# Patient Record
Sex: Female | Born: 1985 | Race: White | Hispanic: No | Marital: Single | Smoking: Former smoker
Health system: Southern US, Community
[De-identification: ages and names within clinical notes are randomized; demographics above are authoritative.]

## PROBLEM LIST (undated history)

## (undated) DIAGNOSIS — G43909 Migraine, unspecified, not intractable, without status migrainosus: Secondary | ICD-10-CM

## (undated) HISTORY — PX: HERNIA REPAIR: SHX51

## (undated) HISTORY — DX: Migraine, unspecified, not intractable, without status migrainosus: G43.909

---
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
## 2007-04-12 ENCOUNTER — Ambulatory Visit: Payer: Self-pay | Admitting: Internal Medicine

## 2008-01-06 DIAGNOSIS — O1493 Unspecified pre-eclampsia, third trimester: Secondary | ICD-10-CM

## 2009-03-07 ENCOUNTER — Ambulatory Visit: Payer: Self-pay | Admitting: Internal Medicine

## 2009-05-29 ENCOUNTER — Ambulatory Visit: Payer: Self-pay | Admitting: Family Medicine

## 2018-05-23 ENCOUNTER — Encounter: Payer: Self-pay | Admitting: Emergency Medicine

## 2018-05-23 ENCOUNTER — Emergency Department
Admission: EM | Admit: 2018-05-23 | Discharge: 2018-05-23 | Disposition: A | Discharge status: Home / Self Care | Payer: Self-pay | Attending: Emergency Medicine | Admitting: Emergency Medicine

## 2018-05-23 ENCOUNTER — Other Ambulatory Visit: Payer: Self-pay

## 2018-05-23 ENCOUNTER — Emergency Department: Payer: Self-pay

## 2018-05-23 DIAGNOSIS — I82811 Embolism and thrombosis of superficial veins of right lower extremities: Secondary | ICD-10-CM | POA: Insufficient documentation

## 2018-05-23 DIAGNOSIS — Z87891 Personal history of nicotine dependence: Secondary | ICD-10-CM | POA: Insufficient documentation

## 2018-05-23 LAB — CBC WITH DIFFERENTIAL/PLATELET
Abs Immature Granulocytes: 0.01 10*3/uL (ref 0.00–0.07)
Basophils Absolute: 0.1 10*3/uL (ref 0.0–0.1)
Basophils Relative: 1 %
Eosinophils Absolute: 0.2 10*3/uL (ref 0.0–0.5)
Eosinophils Relative: 3 %
HCT: 36.1 % (ref 36.0–46.0)
Hemoglobin: 12 g/dL (ref 12.0–15.0)
Immature Granulocytes: 0 %
Lymphocytes Relative: 24 %
Lymphs Abs: 1.4 10*3/uL (ref 0.7–4.0)
MCH: 27.6 pg (ref 26.0–34.0)
MCHC: 33.2 g/dL (ref 30.0–36.0)
MCV: 83.2 fL (ref 80.0–100.0)
Monocytes Absolute: 0.5 10*3/uL (ref 0.1–1.0)
Monocytes Relative: 8 %
Neutro Abs: 3.7 10*3/uL (ref 1.7–7.7)
Neutrophils Relative %: 64 %
Platelets: 238 10*3/uL (ref 150–400)
RBC: 4.34 MIL/uL (ref 3.87–5.11)
RDW: 13 % (ref 11.5–15.5)
WBC: 5.8 10*3/uL (ref 4.0–10.5)
nRBC: 0 % (ref 0.0–0.2)

## 2018-05-23 LAB — COMPREHENSIVE METABOLIC PANEL
ALT: 16 U/L (ref 0–44)
AST: 22 U/L (ref 15–41)
Albumin: 4.3 g/dL (ref 3.5–5.0)
Alkaline Phosphatase: 40 U/L (ref 38–126)
Anion gap: 9 (ref 5–15)
BUN: 7 mg/dL (ref 6–20)
CO2: 25 mmol/L (ref 22–32)
Calcium: 8.9 mg/dL (ref 8.9–10.3)
Chloride: 103 mmol/L (ref 98–111)
Creatinine, Ser: 0.74 mg/dL (ref 0.44–1.00)
GFR calc Af Amer: >60 mL/min (ref >60)
GFR calc non Af Amer: >60 mL/min (ref >60)
Glucose, Bld: 78 mg/dL (ref 70–99)
Potassium: 3.4 mmol/L — ABNORMAL LOW (ref 3.5–5.1)
Sodium: 137 mmol/L (ref 135–145)
Total Bilirubin: 0.5 mg/dL (ref 0.3–1.2)
Total Protein: 7.8 g/dL (ref 6.5–8.1)

## 2018-05-23 LAB — URIC ACID: Uric Acid, Serum: 4.2 mg/dL (ref 2.5–7.1)

## 2018-05-23 NOTE — Discharge Instructions (Addendum)
You have a blood clot in your superficial vein near your ankle.  Take a regular aspirin daily.  Follow-up with your regular doctor in 2 weeks for recheck.  Return to the emergency department if the swelling or pain is increasing.  Wear compression socks.  Stop taking your birth control pills

## 2018-05-23 NOTE — ED Provider Notes (Signed)
[facility] Emergency Department Provider Note  ____________________________________________   First MD Initiated Contact with Patient 05/23/18 1908     (approximate)  I have reviewed the triage vital signs and the nursing notes.   HISTORY  Chief Complaint Poss DVT    HPI Nicole Berg is a 33 y.o. female presents emergency department with right ankle pain and redness with some swelling to the right calf.  She discussed this with her PCP explained to her that she needs to come to the emergency department to rule out DVT.  She denies any chest pain or shortness of breath.  She denies fever or chills.  No history of other joint pain.  She does take birth control pills.  Non-smoker.  She states she does eat hamburger and hot dog tight meat on a regular basis.  No recent increase in EtOH.    History reviewed. No pertinent past medical history.  There are no active problems to display for this patient.   Past Surgical History:  Procedure Laterality Date  . HERNIA REPAIR      Prior to Admission medications   Not on File    Allergies Amoxicillin and Penicillins  History reviewed. No pertinent family history.  Social History Social History   Tobacco Use  . Smoking status: Former Research scientist (life sciences)  . Smokeless tobacco: Never Used  Substance Use Topics  . Alcohol use: Yes    Comment: Rare  . Drug use: Never    Review of Systems  Constitutional: No fever/chills Eyes: No visual changes. ENT: No sore throat. Respiratory: Denies cough Genitourinary: Negative for dysuria. Musculoskeletal: Negative for back pain.  Positive for right leg pain Skin: Negative for rash.    ____________________________________________   PHYSICAL EXAM:  VITAL SIGNS: ED Triage Vitals  Enc Vitals Group     BP 05/23/18 1847 105/67     Pulse Rate 05/23/18 1847 78     Resp --      Temp 05/23/18 1847 98 F (36.7 C)     Temp Source 05/23/18 1847 Oral     SpO2 05/23/18  1847 99 %     Weight 05/23/18 1848 169 lb (76.7 kg)     Height 05/23/18 1848 '5\' 6"'  (1.676 m)     Head Circumference --      Peak Flow --      Pain Score 05/23/18 1849 4     Pain Loc --      Pain Edu? --      Excl. in [place]? --     Constitutional: Alert and oriented. Well appearing and in no acute distress. Eyes: Conjunctivae are normal.  Head: Atraumatic. Nose: No congestion/rhinnorhea. Mouth/Throat: Mucous membranes are moist.   Neck:  supple no lymphadenopathy noted Cardiovascular: Normal rate, regular rhythm. Heart sounds are normal Respiratory: Normal respiratory effort.  No retractions, lungs c t a  GU: deferred Musculoskeletal: FROM all extremities, warm and well perfused, redness and swelling are noted at the right ankle in the medial aspect spreading up into the lower part of the calf.  Negative Homans sign.  Neurovascular appears to be intact. Neurologic:  Normal speech and language.  Skin:  Skin is warm, dry and intact.  Positive redness and swelling to the right ankle Psychiatric: Mood and affect are normal. Speech and behavior are normal.  ____________________________________________   LABS (all labs ordered are listed, but only abnormal results are displayed)  Labs Reviewed  COMPREHENSIVE METABOLIC PANEL - Abnormal; Notable for the following  components:      Result Value   Potassium 3.4 (*)    All other components within normal limits  CBC WITH DIFFERENTIAL/PLATELET  URIC ACID   ____________________________________________   ____________________________________________  RADIOLOGY  Ultrasound right lower leg shows a superficial great saphenous vein thrombosis near the ankle  ____________________________________________   PROCEDURES  Procedure(s) performed: No  Procedures    ____________________________________________   INITIAL IMPRESSION / ASSESSMENT AND PLAN / ED COURSE  Pertinent labs & imaging results that were available during my care of  the patient were reviewed by me and considered in my medical decision making (see chart for details).   Patient is a 33 year old female presents emergency department with concerns of a DVT of the right lower leg due to increased redness and swelling of the right ankle.  Physical exam shows the right ankle to be swollen with a reddened area and streak going to the calf.  Ordered ultrasound of the right lower leg to rule out DVT  Labs ordered    ----------------------------------------- 8:29 PM on 05/23/2018 -----------------------------------------  Ultrasound showed a superficial great saphenous vein thrombosis near the ankle. Labs for CBC, met c, and uric acid are all normal  Explained the findings to the patient.  Due to this being a superficial thrombosis she was instructed to take a full aspirin daily.  Follow-up with her regular doctor in 2 weeks for recheck.  Explained to her she may need an additional ultrasound.  If she is worsening she is to return to the emergency department.  She is to stop taking her birth control pills at this time.  Wear compression socks.  Elevate the leg if possible.  She states she understands and will comply.  She was discharged in stable condition.  As part of my medical decision making, I reviewed the following data within the [place] notes reviewed and incorporated, Labs reviewed CBC, met C, uric acid are normal, Old chart reviewed, Radiograph reviewed ultrasound of the right lower leg shows a superficial thrombosis, Notes from prior ED visits and [place] Controlled Substance Database  ____________________________________________   FINAL CLINICAL IMPRESSION(S) / ED DIAGNOSES  Final diagnoses:  Thrombosis of saphenous vein, right      NEW MEDICATIONS STARTED DURING THIS VISIT:  New Prescriptions   No medications on file     Note:  This document was prepared using Dragon voice recognition software and may include  unintentional dictation errors.    Versie Starks, PA-C 05/23/18 2030    Carrie Mew, MD 05/26/18 [phone]

## 2018-05-23 NOTE — ED Triage Notes (Signed)
Pt presents to ED via POV with c/o R ankle pain and redness and swelling to R calf, pt states sent by PCP to R/o DVT.

## 2019-11-19 ENCOUNTER — Emergency Department
Admission: EM | Admit: 2019-11-19 | Discharge: 2019-11-19 | Disposition: A | Discharge status: Home / Self Care | Payer: BC Managed Care – PPO | Attending: Emergency Medicine | Admitting: Emergency Medicine

## 2019-11-19 ENCOUNTER — Emergency Department: Payer: BC Managed Care – PPO

## 2019-11-19 ENCOUNTER — Other Ambulatory Visit: Payer: Self-pay

## 2019-11-19 DIAGNOSIS — S161XXA Strain of muscle, fascia and tendon at neck level, initial encounter: Secondary | ICD-10-CM | POA: Diagnosis not present

## 2019-11-19 DIAGNOSIS — Y92410 Unspecified street and highway as the place of occurrence of the external cause: Secondary | ICD-10-CM | POA: Diagnosis not present

## 2019-11-19 DIAGNOSIS — M791 Myalgia, unspecified site: Secondary | ICD-10-CM | POA: Diagnosis not present

## 2019-11-19 DIAGNOSIS — S199XXA Unspecified injury of neck, initial encounter: Secondary | ICD-10-CM | POA: Diagnosis present

## 2019-11-19 DIAGNOSIS — Y9389 Activity, other specified: Secondary | ICD-10-CM | POA: Diagnosis not present

## 2019-11-19 DIAGNOSIS — M7918 Myalgia, other site: Secondary | ICD-10-CM

## 2019-11-19 DIAGNOSIS — Z87891 Personal history of nicotine dependence: Secondary | ICD-10-CM | POA: Insufficient documentation

## 2019-11-19 MED ORDER — ORPHENADRINE CITRATE ER 100 MG PO TB12: 100.0000 mg | ORAL_TABLET | Freq: Two times a day (BID) | ORAL | 0 refills | Status: DC

## 2019-11-19 MED ORDER — LIDOCAINE 5 % EX PTCH: 1.0000 | MEDICATED_PATCH | Freq: Two times a day (BID) | CUTANEOUS | 0 refills | Status: DC

## 2019-11-19 MED ORDER — LIDOCAINE 5 % EX PTCH: 1.0000 | MEDICATED_PATCH | Freq: Two times a day (BID) | CUTANEOUS | 0 refills | Status: AC

## 2019-11-19 MED ORDER — NAPROXEN 500 MG PO TABS: 500.0000 mg | ORAL_TABLET | Freq: Two times a day (BID) | ORAL | Status: DC
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
## 2019-11-19 NOTE — ED Notes (Signed)
Patient verbalizes understanding of discharge instructions. Opportunity for questioning and answers were provided. Armband removed by staff, pt discharged from ED. Pt ambulated out to lobby  
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
## 2019-11-19 NOTE — Discharge Instructions (Signed)
No acute findings on x-ray of the cervical/lumbar spine.  Follow discharge care instructions using heat instead of cold as directed.  Take medication as directed.

## 2020-01-01 ENCOUNTER — Emergency Department
Admission: EM | Admit: 2020-01-01 | Discharge: 2020-01-01 | Disposition: A | Discharge status: Home / Self Care | Payer: BC Managed Care – PPO | Attending: Emergency Medicine | Admitting: Emergency Medicine

## 2020-01-01 ENCOUNTER — Encounter: Payer: Self-pay | Admitting: Emergency Medicine

## 2020-01-01 DIAGNOSIS — R0981 Nasal congestion: Secondary | ICD-10-CM | POA: Diagnosis present

## 2020-01-01 DIAGNOSIS — J069 Acute upper respiratory infection, unspecified: Secondary | ICD-10-CM | POA: Diagnosis not present

## 2020-01-01 DIAGNOSIS — Z20822 Contact with and (suspected) exposure to covid-19: Secondary | ICD-10-CM | POA: Diagnosis not present

## 2020-01-01 DIAGNOSIS — Z87891 Personal history of nicotine dependence: Secondary | ICD-10-CM | POA: Insufficient documentation

## 2020-01-01 DIAGNOSIS — B349 Viral infection, unspecified: Secondary | ICD-10-CM | POA: Diagnosis not present

## 2020-01-01 LAB — RESP PANEL BY RT-PCR (FLU A&B, COVID) ARPGX2
Influenza A by PCR: NEGATIVE
Influenza B by PCR: NEGATIVE
SARS Coronavirus 2 by RT PCR: POSITIVE — AB

## 2020-01-01 MED ORDER — FLUTICASONE PROPIONATE 50 MCG/ACT NA SUSP: 1.0000 | Freq: Two times a day (BID) | NASAL | 0 refills | Status: DC

## 2020-01-01 MED ORDER — PSEUDOEPH-BROMPHEN-DM 30-2-10 MG/5ML PO SYRP: 10.0000 mL | ORAL_SOLUTION | Freq: Four times a day (QID) | ORAL | 0 refills | Status: DC | PRN

## 2020-01-01 NOTE — ED Triage Notes (Signed)
C/O runny nose, fever today.  States was around a coworker who has been exposed to COVID.  AAOx3.  Skin warm and dry. NAD

## 2020-01-01 NOTE — ED Provider Notes (Signed)
[facility] Emergency Department Provider Note  ____________________________________________  Time seen: Approximately 7:01 PM  I have reviewed the triage vital signs and the nursing notes.   HISTORY  Chief Complaint URI    HPI Nicole Berg is a 34 y.o. female who presents the emergency department complaining of nasal congestion, sore throat, cough.  Patient states that she was exposed to a coworker who has tested positive for Covid.  Patient with no reported fevers or chills, difficulty breathing, GI complaints.  Patient is here primarily for testing for Covid.  No significant past medical history.  No chronic medications.  No other complaints at this time.         History reviewed. No pertinent past medical history.  There are no problems to display for this patient.   Past Surgical History:  Procedure Laterality Date  . HERNIA REPAIR      Prior to Admission medications   Medication Sig Start Date End Date Taking? Authorizing Provider  brompheniramine-pseudoephedrine-DM 30-2-10 MG/5ML syrup Take 10 mLs by mouth 4 (four) times daily as needed. 01/01/20   Topaz Raglin, Delorise Royals, PA-C  fluticasone (FLONASE) 50 MCG/ACT nasal spray Place 1 spray into both nostrils 2 (two) times daily. 01/01/20   Gwynn Chalker, Delorise Royals, PA-C  lidocaine (LIDODERM) 5 % Place 1 patch onto the skin every 12 (twelve) hours. Remove & Discard patch within 12 hours or as directed by MD 11/19/19 11/18/20  Joni Reining, PA-C  naproxen (NAPROSYN) 500 MG tablet Take 1 tablet (500 mg total) by mouth 2 (two) times daily with a meal. 11/19/19   Joni Reining, PA-C  orphenadrine (NORFLEX) 100 MG tablet Take 1 tablet (100 mg total) by mouth 2 (two) times daily. 11/19/19   Joni Reining, PA-C    Allergies Amoxicillin and Penicillins  No family history on file.  Social History Social History   Tobacco Use  . Smoking status: Former Games developer  . Smokeless tobacco: Never Used   Substance Use Topics  . Alcohol use: Yes    Comment: Rare  . Drug use: Never     Review of Systems  Constitutional: No fever/chills Eyes: No visual changes. No discharge ENT: Nasal congestion and sore throat Cardiovascular: no chest pain. Respiratory: Positive cough. No SOB. Gastrointestinal: No abdominal pain.  No nausea, no vomiting.  No diarrhea.  No constipation. Musculoskeletal: Negative for musculoskeletal pain. Skin: Negative for rash, abrasions, lacerations, ecchymosis. Neurological: Negative for headaches, focal weakness or numbness.  10 System ROS otherwise negative.  ____________________________________________   PHYSICAL EXAM:  VITAL SIGNS: ED Triage Vitals  Enc Vitals Group     BP 01/01/20 1755 97/64     Pulse Rate 01/01/20 1755 68     Resp 01/01/20 1755 18     Temp 01/01/20 1755 98 F (36.7 C)     Temp Source 01/01/20 1755 Oral     SpO2 01/01/20 1755 100 %     Weight 01/01/20 1720 167 lb 1.7 oz (75.8 kg)     Height 01/01/20 1720 5\' 6"  (1.676 m)     Head Circumference --      Peak Flow --      Pain Score 01/01/20 1720 0     Pain Loc --      Pain Edu? --      Excl. in [place]? --      Constitutional: Alert and oriented. Well appearing and in no acute distress. Eyes: Conjunctivae are normal. PERRL. EOMI. Head: Atraumatic. ENT:  Ears:       Nose: Significant clear congestion/rhinnorhea.      Mouth/Throat: Mucous membranes are moist.  No oropharyngeal erythema or edema.  Uvula is midline. Neck: No stridor.    Lymph: No appreciable cervical lymphadenopathy Cardiovascular: Normal rate, regular rhythm. Normal S1 and S2.  Good peripheral circulation. Respiratory: Normal respiratory effort without tachypnea or retractions. Lungs CTAB. Good air entry to the bases with no decreased or absent breath sounds. Gastrointestinal: Bowel sounds 4 quadrants. Soft and nontender to palpation. No guarding or rigidity. No palpable masses. No distention.   Musculoskeletal: Full range of motion to all extremities. No gross deformities appreciated. Neurologic:  Normal speech and language. No gross focal neurologic deficits are appreciated.  Skin:  Skin is warm, dry and intact. No rash noted. Psychiatric: Mood and affect are normal. Speech and behavior are normal. Patient exhibits appropriate insight and judgement.   ____________________________________________   LABS (all labs ordered are listed, but only abnormal results are displayed)  Labs Reviewed  RESP PANEL BY RT-PCR (FLU A&B, COVID) [id number]   ____________________________________________  EKG   ____________________________________________  RADIOLOGY   No results found.  ____________________________________________    PROCEDURES  Procedure(s) performed:    Procedures    Medications - No data to display   ____________________________________________   INITIAL IMPRESSION / ASSESSMENT AND PLAN / ED COURSE  Pertinent labs & imaging results that were available during my care of the patient were reviewed by me and considered in my medical decision making (see chart for details).  Review of the [place] CSRS was performed in accordance of the [facility] prior to dispensing any controlled drugs.           Patient's diagnosis is consistent with viral illness, suspected COVID-19.  Patient presented to the emergency department with URI symptoms after having close contact with a coworker who tested positive for COVID-19.  Given her symptoms she was required to be tested by her work.  Exam is reassuring.  No indication for labs or imaging other than Covid swab.  Patient will be tested at this time.  I will place the patient on Flonase and Bromfed cough syrup for symptom control.  Follow-up primary care as needed.  Return precautions discussed with the patient..  Patient is given ED precautions to return to the ED for any worsening or new  symptoms.     ____________________________________________  FINAL CLINICAL IMPRESSION(S) / ED DIAGNOSES  Final diagnoses:  Viral illness      NEW MEDICATIONS STARTED DURING THIS VISIT:  ED Discharge Orders         Ordered    fluticasone (FLONASE) 50 MCG/ACT nasal spray  2 times daily        01/01/20 1909    brompheniramine-pseudoephedrine-DM 30-2-10 MG/5ML syrup  4 times daily PRN        01/01/20 1909              This chart was dictated using voice recognition software/Dragon. Despite best efforts to proofread, errors can occur which can change the meaning. Any change was purely unintentional.    Racheal Patches, PA-C 01/01/20 Brent Bulla, MD 01/01/20 2242

## 2020-01-01 NOTE — ED Notes (Addendum)
Pt was exposed to a coworker on Friday who tested positive for covid. Pt reports fever and congestion beginning last night.   Pt reports headache, congested cough, chills, mild nausea, and mild SOB with activity. Pt denies cp, abd pain, vomiting, diarrhea  Reports some intermittent dizziness.   No covid vaccines

## 2020-01-02 ENCOUNTER — Telehealth: Payer: Self-pay | Admitting: Nurse Practitioner

## 2020-01-02 ENCOUNTER — Telehealth (HOSPITAL_COMMUNITY): Payer: Self-pay

## 2020-01-02 NOTE — Telephone Encounter (Signed)
Called to Discuss with patient about Covid symptoms and the use of the monoclonal antibody infusion for those with mild to moderate Covid symptoms and at a high risk of hospitalization.     Pt appears to qualify for this infusion due to co-morbid conditions and/or a member of an at-risk group in accordance with the FDA Emergency Use Authorization. (asthma/bmi >25).  Patient pre-screened by RN and verbalized request for infusion.    Unable to reach pt. Voicemail full. My Chart message sent.   Willette Alma, NP [facility]  [phone]

## 2020-01-02 NOTE — Telephone Encounter (Signed)
Called patient to pre-screen for monoclonal antibody infusion after receiving recent positive test. Patient qualifies based off off co-morbid condition and/or member of an at risk group. CPT and REV codes provided for insurance purposes.   Hx of asthma and BMI >25  Patient is interested in learning more about the infusion. RN forwarded information to APP's for additional screening/scheduling.   Nicole Berg Loyola Mast, RN

## 2020-05-16 IMAGING — US RIGHT LOWER EXTREMITY VENOUS ULTRASOUND
1 series · 13 of 24 positions shown · non-contrast
Comparison: None

CLINICAL DATA: Right lower extremity pain and swelling



[Series 1: right lower extremity venous ultrasound · 13 of 57 slices shown]
[im 1/57]
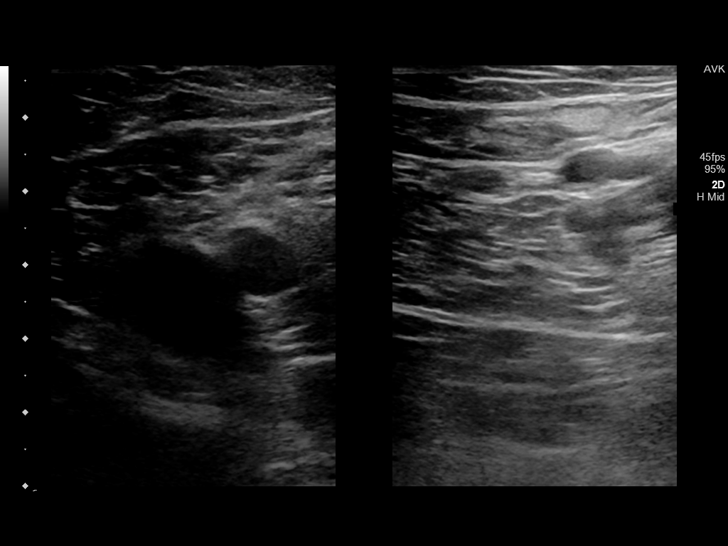
[im 5/57]
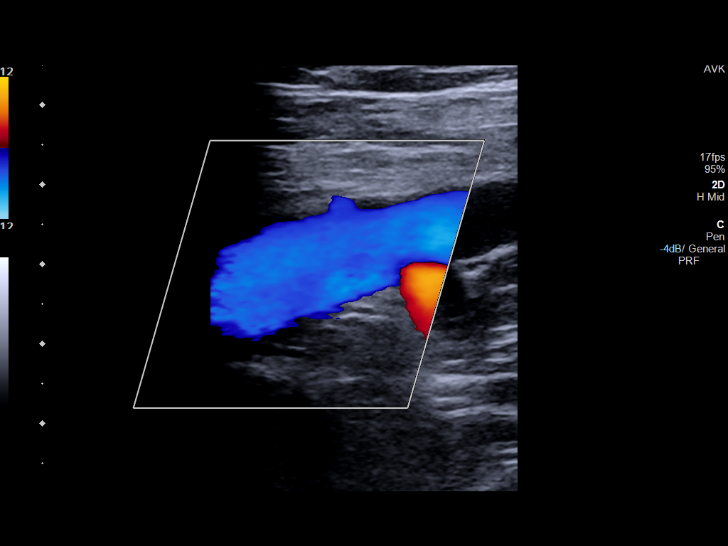
[im 10/57]
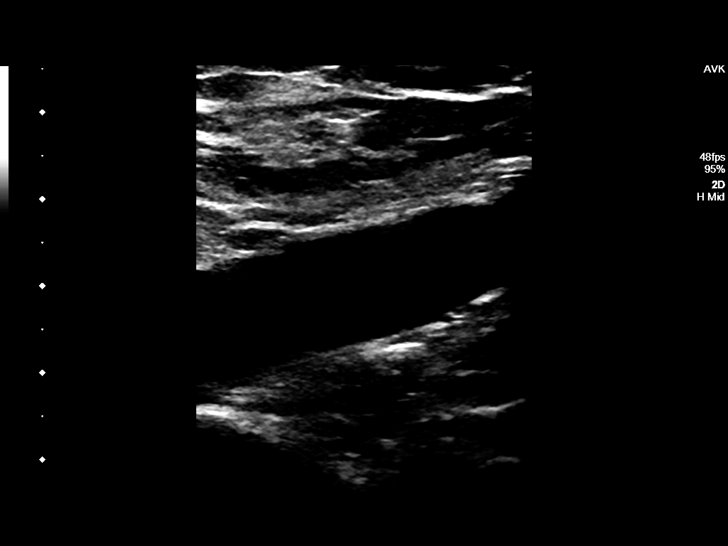
[im 15/57]
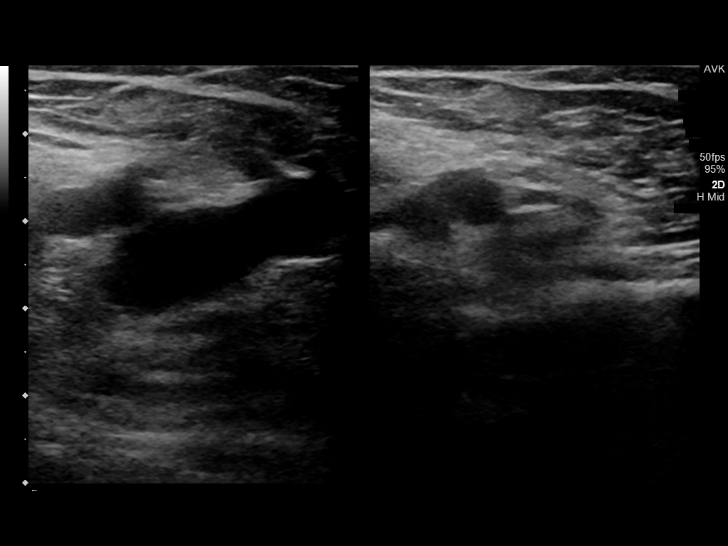
[im 20/57]
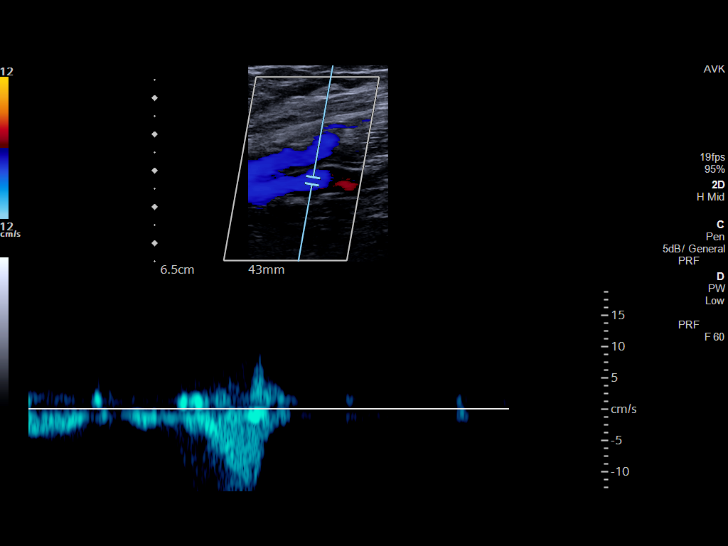
[im 25/57]
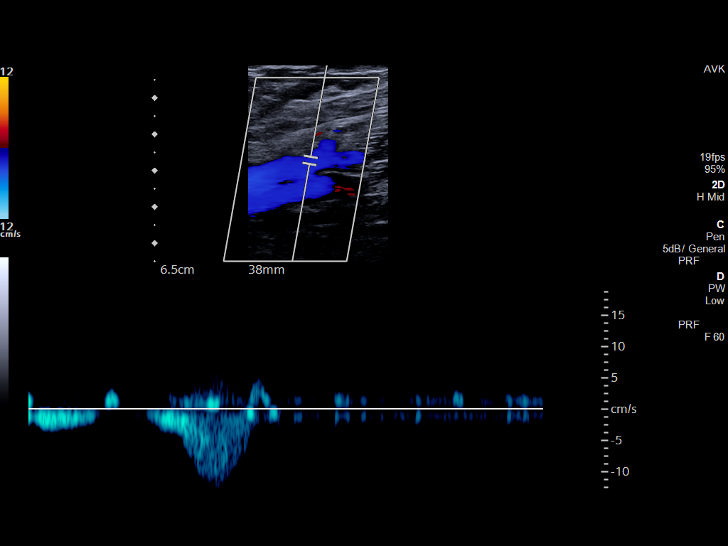
[im 30/57]
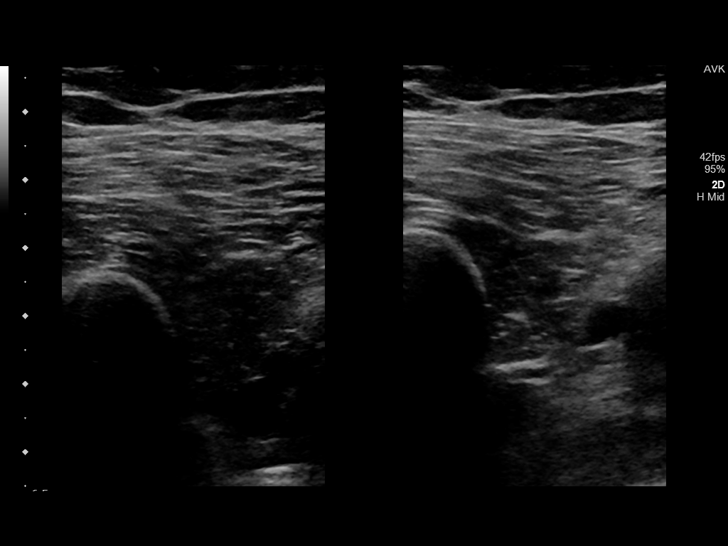
[im 32/57]
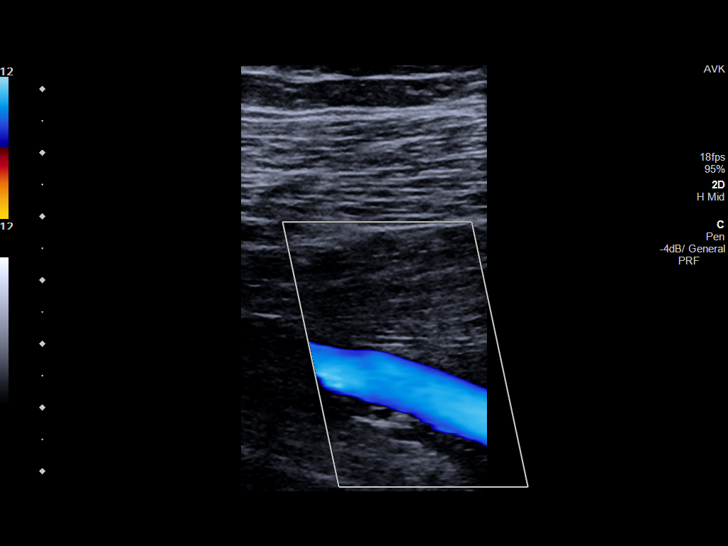
[im 37/57]
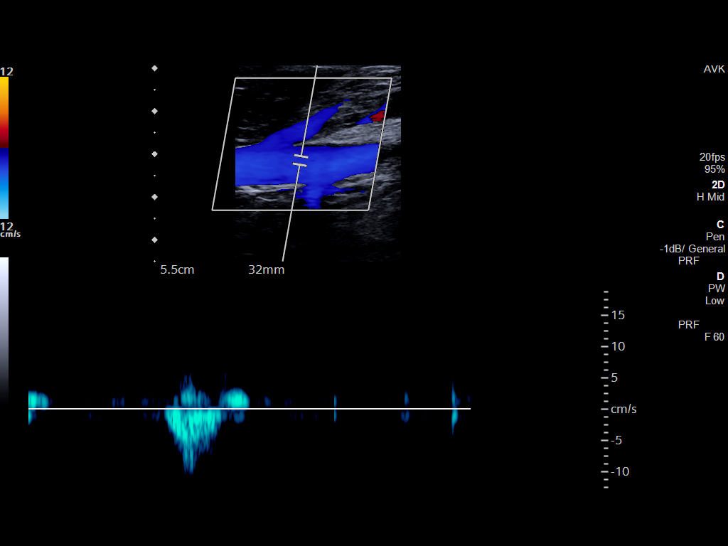
[im 42/57]
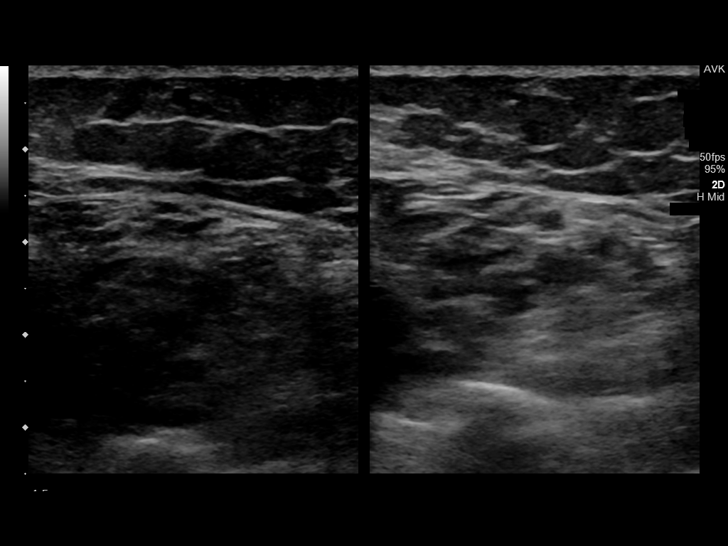
[im 47/57]
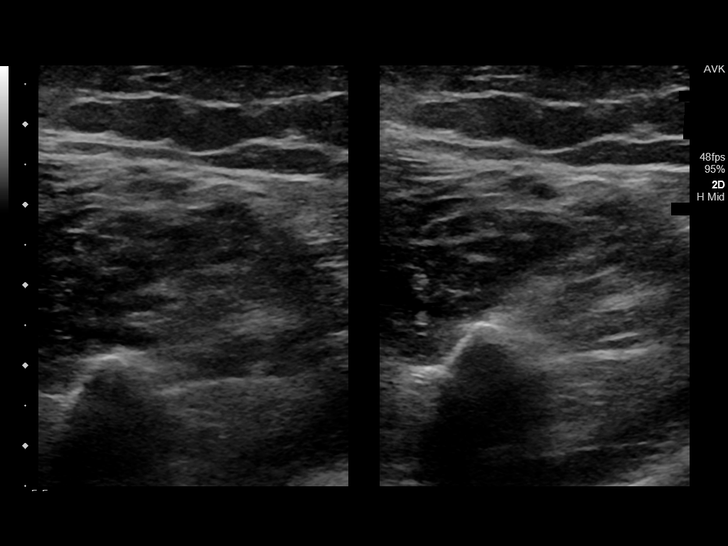
[im 52/57]
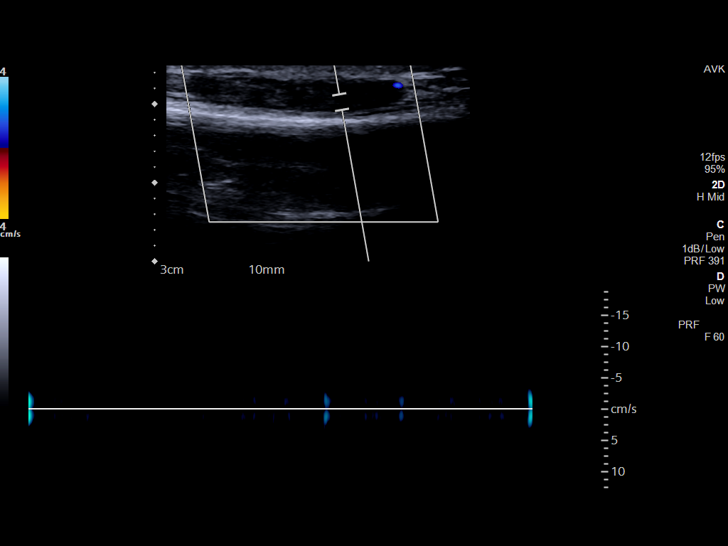
[im 57/57]
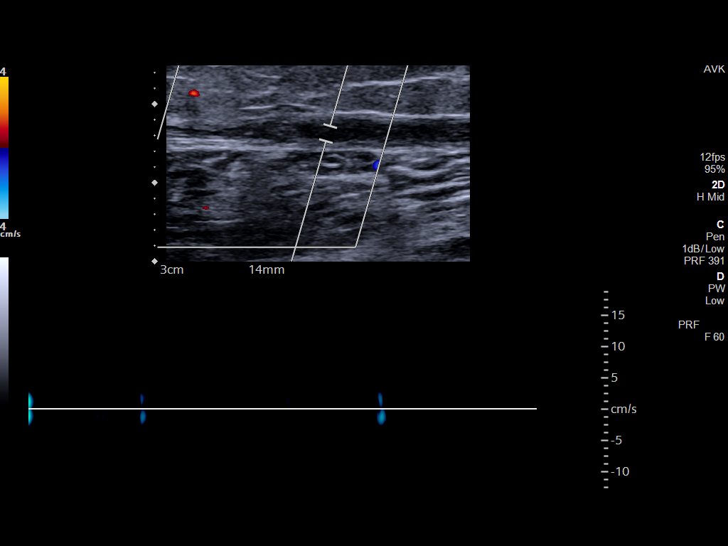

[13 of 24 positions shown; findings below may reference images not displayed]

FINDINGS: Contralateral Common Femoral Vein: Respiratory phasicity is normal
and symmetric with the symptomatic side. No evidence of thrombus.
Normal compressibility.

Common Femoral Vein: No evidence of thrombus. Normal
compressibility, respiratory phasicity and response to augmentation.

Saphenofemoral Junction: No evidence of thrombus. Normal
compressibility and flow on color Doppler imaging.

Profunda Femoral Vein: No evidence of thrombus. Normal
compressibility and flow on color Doppler imaging.

Femoral Vein: No evidence of thrombus. Normal compressibility,
respiratory phasicity and response to augmentation.

Popliteal Vein: No evidence of thrombus. Normal compressibility,
respiratory phasicity and response to augmentation.

Calf Veins: No evidence of thrombus. Normal compressibility and flow
on color Doppler imaging.

Superficial Great Saphenous Vein: There is noncompressible,
occlusive thrombus within the superficial great saphenous vein at
the calf/ankle.

Venous Reflux:  None.

Other Findings:  None.
IMPRESSION: Occlusive thrombus within the right superficial great saphenous vein
at the calf/ankle.

## 2020-07-29 ENCOUNTER — Emergency Department: Payer: BC Managed Care – PPO

## 2020-07-29 ENCOUNTER — Emergency Department
Admission: EM | Admit: 2020-07-29 | Discharge: 2020-07-29 | Disposition: A | Discharge status: Home / Self Care | Payer: BC Managed Care – PPO | Attending: Emergency Medicine | Admitting: Emergency Medicine

## 2020-07-29 ENCOUNTER — Other Ambulatory Visit: Payer: Self-pay

## 2020-07-29 ENCOUNTER — Encounter: Payer: Self-pay | Admitting: Emergency Medicine

## 2020-07-29 DIAGNOSIS — S93401A Sprain of unspecified ligament of right ankle, initial encounter: Secondary | ICD-10-CM | POA: Diagnosis not present

## 2020-07-29 DIAGNOSIS — S99911A Unspecified injury of right ankle, initial encounter: Secondary | ICD-10-CM | POA: Diagnosis present

## 2020-07-29 DIAGNOSIS — Z87891 Personal history of nicotine dependence: Secondary | ICD-10-CM | POA: Diagnosis not present

## 2020-07-29 DIAGNOSIS — X501XXA Overexertion from prolonged static or awkward postures, initial encounter: Secondary | ICD-10-CM | POA: Diagnosis not present

## 2020-07-29 DIAGNOSIS — Y990 Civilian activity done for income or pay: Secondary | ICD-10-CM | POA: Diagnosis not present

## 2020-07-29 DIAGNOSIS — M25579 Pain in unspecified ankle and joints of unspecified foot: Secondary | ICD-10-CM

## 2020-07-29 NOTE — Discharge Instructions (Addendum)
Follow-up with orthopedics. Elevate and ice the ankle Return emergency department worsening Follow-up Worker's Comp. instructions for the job for you were injured No work at the second job until Sunday

## 2020-07-29 NOTE — ED Triage Notes (Signed)
Pt comes into the ED via POV c/o right ankle pain after twisting it at work,.  Pt has no deformity and has normal gait upon triage.  Pt in NAD.

## 2020-07-29 NOTE — ED Provider Notes (Signed)
[facility] Emergency Department Provider Note  ____________________________________________   Event Date/Time   First MD Initiated Contact with Patient 07/29/20 1124     (approximate)  I have reviewed the triage vital signs and the nursing notes.   HISTORY  Chief Complaint Ankle Pain    HPI Nicole Berg is a 35 y.o. female presents emergency department after twisting her ankle at work yesterday.  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  Patient states been swollen and tender since last night.  No numbness or tingling  History reviewed. No pertinent past medical history.  There are no problems to display for this patient.   Past Surgical History:  Procedure Laterality Date   HERNIA REPAIR      Prior to Admission medications   Medication Sig Start Date End Date Taking? Authorizing Provider  brompheniramine-pseudoephedrine-DM 30-2-10 MG/5ML syrup Take 10 mLs by mouth 4 (four) times daily as needed. 01/01/20   Cuthriell, Delorise Royals, PA-C  fluticasone (FLONASE) 50 MCG/ACT nasal spray Place 1 spray into both nostrils 2 (two) times daily. 01/01/20   Cuthriell, Delorise Royals, PA-C  lidocaine (LIDODERM) 5 % Place 1 patch onto the skin every 12 (twelve) hours. Remove & Discard patch within 12 hours or as directed by MD 11/19/19 11/18/20  Joni Reining, PA-C  naproxen (NAPROSYN) 500 MG tablet Take 1 tablet (500 mg total) by mouth 2 (two) times daily with a meal. 11/19/19   Joni Reining, PA-C  orphenadrine (NORFLEX) 100 MG tablet Take 1 tablet (100 mg total) by mouth 2 (two) times daily. 11/19/19   Joni Reining, PA-C    Allergies Amoxicillin and Penicillins  History reviewed. No pertinent family history.  Social History Social History   Tobacco Use   Smoking status: Former    Pack years: 0.00   Smokeless tobacco: Never   Substance Use Topics   Alcohol use: Yes    Comment: Rare   Drug use: Never    Review of Systems  Constitutional: No fever/chills Eyes: No visual changes. ENT: No sore throat. Respiratory: Denies cough Cardiovascular: Denies chest pain Gastrointestinal: Denies abdominal pain Genitourinary: Negative for dysuria. Musculoskeletal: Negative for back pain.  Positive for right ankle pain Skin: Negative for rash. Psychiatric: no mood changes,     ____________________________________________   PHYSICAL EXAM:  VITAL SIGNS: ED Triage Vitals  Enc Vitals Group     BP 07/29/20 1021 110/70     Pulse Rate 07/29/20 1021 68     Resp 07/29/20 1021 16     Temp 07/29/20 1021 98 F (36.7 C)     Temp Source 07/29/20 1021 Oral     SpO2 07/29/20 1021 100 %     Weight 07/29/20 1019 170 lb (77.1 kg)     Height 07/29/20 1019 5\' 6"  (1.676 m)     Head Circumference --      Peak Flow --      Pain Score 07/29/20 1014 5     Pain Loc --      Pain Edu? --      Excl. in GC? --     Constitutional: Alert and oriented. Well appearing and in no acute distress. Eyes: Conjunctivae are normal.  Head: Atraumatic. Nose: No congestion/rhinnorhea. Mouth/Throat: Mucous membranes are moist.   Neck:  supple no lymphadenopathy noted Cardiovascular: Normal rate, regular rhythm.  Respiratory: Normal respiratory effort.  No retractions GU: deferred Musculoskeletal: FROM all extremities, warm and well perfused, right ankle is tender and swollen laterally, full range of motion noted, neurovascular is intact Neurologic:  Normal speech and language.  Skin:  Skin is warm, dry and intact. No rash noted. Psychiatric: Mood and affect are normal. Speech and behavior are normal.  ____________________________________________   LABS (all labs ordered are listed, but only abnormal results are displayed)  Labs Reviewed - No data to  display ____________________________________________   ____________________________________________  RADIOLOGY  X-ray of the right ankle  ____________________________________________   PROCEDURES  Procedure(s) performed: Stirrup Aircast applied by nursing staff   Procedures    ____________________________________________   INITIAL IMPRESSION / ASSESSMENT AND PLAN / ED COURSE  Pertinent labs & imaging results that were available during my care of the patient were reviewed by me and considered in my medical decision making (see chart for details).   Patient is 35 year old female presents with right ankle injury.  See HPI.  Physical exam shows patient per stable  X-ray of the right ankle reviewed by me confirmed by radiology to be negative for fracture, soft tissue swelling noted.  Explained the findings to the patient.  She was placed in a stirrup splint.  Given work restrictions.  Work note for her second job.  She is to elevate and ice.  Take over-the-counter Tylenol or ibuprofen as needed for pain.  Follow-up with orthopedics.  She is discharged stable condition.     Nicole Berg was evaluated in Emergency Department on 07/29/2020 for the symptoms described in the history of present illness. She was evaluated in the context of the global COVID-19 pandemic, which necessitated consideration that the patient might be at risk for infection with the SARS-CoV-2 virus that causes COVID-19. Institutional protocols and algorithms that pertain to the evaluation of patients at risk for COVID-19 are in a state of rapid change based on information released by regulatory bodies including the CDC and federal and state organizations. These policies and algorithms were followed during the patient's care in the ED.    As part of my medical decision making, I reviewed the following data within the electronic MEDICAL RECORD NUMBER Nursing notes reviewed and incorporated, Old chart reviewed, Radiograph  reviewed , Notes from prior ED visits, and [place] Controlled Substance Database  ____________________________________________   FINAL CLINICAL IMPRESSION(S) / ED DIAGNOSES  Final diagnoses:  Ankle pain  Sprain of right ankle, unspecified ligament, initial encounter      NEW MEDICATIONS STARTED DURING THIS VISIT:  Discharge Medication List as of 07/29/2020 12:01 PM       Note:  This document was prepared using Dragon voice recognition software and may include unintentional dictation errors.    Faythe Ghee, PA-C 07/29/20 1313    Shaune Pollack, MD 08/02/20 [phone]

## 2021-04-21 ENCOUNTER — Other Ambulatory Visit: Payer: BC Managed Care – PPO

## 2021-05-11 ENCOUNTER — Ambulatory Visit (INDEPENDENT_AMBULATORY_CARE_PROVIDER_SITE_OTHER): Payer: Self-pay | Admitting: Obstetrics & Gynecology

## 2021-05-11 ENCOUNTER — Ambulatory Visit: Payer: Self-pay

## 2021-05-11 ENCOUNTER — Other Ambulatory Visit (HOSPITAL_COMMUNITY)
Admission: RE | Admit: 2021-05-11 | Discharge: 2021-05-11 | Disposition: A | Discharge status: Home / Self Care | Payer: Medicaid Other | Source: Ambulatory Visit | Attending: Obstetrics & Gynecology | Admitting: Obstetrics & Gynecology

## 2021-05-11 ENCOUNTER — Encounter: Payer: Self-pay | Admitting: Obstetrics & Gynecology

## 2021-05-11 VITALS — BP 113/74 | HR 82 | Wt 205.0 lb

## 2021-05-11 DIAGNOSIS — Z8759 Personal history of other complications of pregnancy, childbirth and the puerperium: Secondary | ICD-10-CM

## 2021-05-11 DIAGNOSIS — Z3A13 13 weeks gestation of pregnancy: Secondary | ICD-10-CM

## 2021-05-11 DIAGNOSIS — O09521 Supervision of elderly multigravida, first trimester: Secondary | ICD-10-CM | POA: Diagnosis not present

## 2021-05-11 DIAGNOSIS — Z3490 Encounter for supervision of normal pregnancy, unspecified, unspecified trimester: Secondary | ICD-10-CM | POA: Insufficient documentation

## 2021-05-11 DIAGNOSIS — Z86718 Personal history of other venous thrombosis and embolism: Secondary | ICD-10-CM

## 2021-05-11 DIAGNOSIS — O09299 Supervision of pregnancy with other poor reproductive or obstetric history, unspecified trimester: Secondary | ICD-10-CM

## 2021-05-11 DIAGNOSIS — G43909 Migraine, unspecified, not intractable, without status migrainosus: Secondary | ICD-10-CM | POA: Insufficient documentation

## 2021-05-11 DIAGNOSIS — O09529 Supervision of elderly multigravida, unspecified trimester: Secondary | ICD-10-CM | POA: Insufficient documentation

## 2021-05-11 DIAGNOSIS — O99611 Diseases of the digestive system complicating pregnancy, first trimester: Secondary | ICD-10-CM

## 2021-05-11 DIAGNOSIS — O0991 Supervision of high risk pregnancy, unspecified, first trimester: Secondary | ICD-10-CM

## 2021-05-11 DIAGNOSIS — O99210 Obesity complicating pregnancy, unspecified trimester: Secondary | ICD-10-CM

## 2021-05-11 DIAGNOSIS — O98811 Other maternal infectious and parasitic diseases complicating pregnancy, first trimester: Secondary | ICD-10-CM

## 2021-05-11 DIAGNOSIS — K5900 Constipation, unspecified: Secondary | ICD-10-CM

## 2021-05-11 DIAGNOSIS — A749 Chlamydial infection, unspecified: Secondary | ICD-10-CM

## 2021-05-11 DIAGNOSIS — O0990 Supervision of high risk pregnancy, unspecified, unspecified trimester: Secondary | ICD-10-CM | POA: Insufficient documentation

## 2021-05-11 DIAGNOSIS — O219 Vomiting of pregnancy, unspecified: Secondary | ICD-10-CM

## 2021-05-11 MED ORDER — ASPIRIN EC 81 MG PO TBEC: 81.0000 mg | DELAYED_RELEASE_TABLET | Freq: Every day | ORAL | 2 refills | Status: DC

## 2021-05-11 MED ORDER — PROMETHAZINE HCL 25 MG PO TABS: 25.0000 mg | ORAL_TABLET | Freq: Four times a day (QID) | ORAL | 2 refills | Status: DC | PRN

## 2021-05-11 MED ORDER — ONDANSETRON 4 MG PO TBDP: 4.0000 mg | ORAL_TABLET | Freq: Four times a day (QID) | ORAL | 0 refills | Status: DC | PRN

## 2021-05-11 MED ORDER — ENOXAPARIN SODIUM 40 MG/0.4ML IJ SOSY: 40.0000 mg | PREFILLED_SYRINGE | INTRAMUSCULAR | 3 refills | Status: DC

## 2021-05-11 MED ORDER — SENNOSIDES-DOCUSATE SODIUM 8.6-50 MG PO TABS: 2.0000 | ORAL_TABLET | Freq: Two times a day (BID) | ORAL | 2 refills | Status: AC | PRN

## 2021-05-11 NOTE — Progress Notes (Signed)
? ?History:  ? Nicole Berg is a 36 y.o. G3P2002 at [redacted]w[redacted]d by LMP, early ultrasound being seen today for her first obstetrical visit.  Her obstetrical history is significant for advanced maternal age and history of pre-eclampsia.  Had term SVDs, with no epidural, largest baby was first one was 9 lb 1oz. Of note, history of LLE DVT in 2020 after taking Sprintec.  Patient does intend to breast feed. Pregnancy history fully reviewed. ? ?Patient reports nausea, vomiting, and constipation   Over the counter therapies not working, desires prescribed medication. Also reports mild brown discharge off and on, no bright reed bleeding.  ?  ? ?  ?HISTORY: ?OB History  ?Gravida Para Term Preterm AB Living  ?3 2 2  0 0 2  ?SAB IAB Ectopic Multiple Live Births  ?0 0 0 0 2  ?  ?# Outcome Date GA Lbr Len/2nd Weight Sex Delivery Anes PTL Lv  ?3 Current           ?2 Term 01/06/08 [redacted]w[redacted]d  8 lb (3.629 kg) M Vag-Spont  Y LIV  ?   Complications: Preeclampsia, third trimester  ?1 Term 10/21/05 [redacted]w[redacted]d  9 lb 1 oz (4.111 kg) F Vag-Spont   LIV  ?  ?Last pap smear was done over three years ago and was normal ? ?Past Medical History:  ?Diagnosis Date  ? DVT (deep venous thrombosis) ([place]) 2000  ? Right  leg, due to OCPs  ? Migraines   ? ?Past Surgical History:  ?Procedure Laterality Date  ? HERNIA REPAIR    ? ?Family History  ?Problem Relation Age of Onset  ? Diabetes Mother   ? Lung cancer Mother   ? Hypertension Father   ? Diabetes Paternal Grandfather   ? ?Social History  ? ?Tobacco Use  ? Smoking status: Former  ? Smokeless tobacco: Never  ?Vaping Use  ? Vaping Use: Never used  ?Substance Use Topics  ? Alcohol use: Not Currently  ?  Comment: Rare  ? Drug use: Never  ? ?Allergies  ?Allergen Reactions  ? Amoxicillin Hives  ? Penicillins Hives and Itching  ? ?Current Outpatient Medications on File Prior to Visit  ?Medication Sig Dispense Refill  ? acetaminophen (TYLENOL) 500 MG tablet Take 500 mg by mouth every 6 (six) hours as needed.    ?  cetirizine (ZYRTEC) 10 MG tablet Take 10 mg by mouth daily.    ? Prenatal MV & Min w/FA-DHA (PRENATAL ADULT GUMMY/DHA/FA PO) Take by mouth.    ? ?No current facility-administered medications on file prior to visit.  ? ?Review of Systems ?Pertinent items noted in HPI and remainder of comprehensive ROS otherwise negative. ? ?Physical Exam:  ? ?Vitals:  ? 05/11/21 1456  ?BP: 113/74  ?Pulse: 82  ?Weight: 205 lb (93 kg)  ? ?Fetal Heart Rate (bpm): U/S:159   ?Patient informed that the ultrasound is considered a limited obstetric ultrasound and is not intended to be a complete ultrasound exam.  Patient also informed that the ultrasound is not being completed with the intent of assessing for fetal or placental anomalies or any pelvic abnormalities.  Explained that the purpose of today?s ultrasound is to assess for fetal heart rate.  Patient acknowledges the purpose of the exam and the limitations of the study. ?General: well-developed, well-nourished female in no acute distress  ?Breasts:  normal appearance, no masses or tenderness bilaterally, exam done in the presence of a chaperone.   ?Skin: normal coloration and turgor, no rashes  ?Neurologic:  oriented, normal, negative, normal mood  ?Extremities: normal strength, tone, and muscle mass, ROM of all joints is normal  ?HEENT PERRLA, extraocular movement intact and sclera clear, anicteric  ?Neck supple and no masses  ?Cardiovascular: regular rate and rhythm  ?Respiratory:  no respiratory distress, normal breath sounds  ?Abdomen: soft, non-tender; bowel sounds normal; no masses,  no organomegaly  ?Pelvic: normal external genitalia, no lesions, normal vaginal mucosa, scant brown vaginal discharge cleaned with fox swabs, multiparous cervix, pap smear done. Exam done in the presence of a chaperone.   ?  ?Assessment:  ?  ?Pregnancy: [id number] ?Patient Active Problem List  ? Diagnosis Date Noted  ? Supervision of high-risk pregnancy 05/11/2021  ? Advanced maternal age in  multigravida 05/11/2021  ? History of pre-eclampsia in prior pregnancy, currently pregnant 05/11/2021  ? Obesity in pregnancy, antepartum 05/11/2021  ? Migraines 05/11/2021  ? 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 ?  ?Plan:  ?  ?1. Maternal DVT (deep vein thrombosis), history of ?Given that DVT was precipitated by OCPs and pregnancy is a high estrogen state, prophylactic anticoagulation recommended.  Lovenox 40 mg [place] daily prescribed. Will be on this throughout pregnancy and until six weeks postpartum. ?- enoxaparin (LOVENOX) 40 MG/0.4ML injection; Inject 0.4 mLs (40 mg total) into the skin daily.  Dispense: 90 mL; Refill: 3 ? ?2. History of pre-eclampsia in prior pregnancy, currently pregnant ?Labs to be checked.  Needs weekly BP checks.  ASA prescribed.  ?- [place] MFM OB DETAIL +14 WK; Future ?- Comprehensive metabolic panel ?- Hemoglobin A1c ?- TSH ?- Protein / creatinine ratio, urine ?- aspirin EC 81 MG tablet; Take 1 tablet (81 mg total) by mouth daily. Take after 12 weeks for prevention of preeclampsia later in pregnancy  Dispense: 300 tablet; Refill: 2 ? ?3. Nausea and vomiting during pregnancy ?Antiemetics prescribed. ?- ondansetron (ZOFRAN-ODT) 4 MG disintegrating tablet; Take 1 tablet (4 mg total) by mouth every 6 (six) hours as needed for nausea.  Dispense: 20 tablet; Refill: 0 ?- promethazine (PHENERGAN) 25 MG tablet; Take 1 tablet (25 mg total) by mouth every 6 (six) hours as needed for nausea or vomiting.  Dispense: 30 tablet; Refill: 2 ? ?4. Constipation during pregnancy in first trimester ?Senokot prescribed. ?- senna-docusate (SENOKOT-S) 8.6-50 MG tablet; Take 2 tablets by mouth 2 (two) times daily as needed for mild constipation or moderate constipation.  Dispense: 30 tablet; Refill: 2 ? ?5. Obesity in pregnancy, antepartum ?TWG 11-20 lbs recommended. ? ?6. Multigravida of advanced maternal age in first trimester ?NIPS to be done, detailed anatomy  ultrasound. ?- Genetic Screening ?- [place] MFM OB DETAIL +14 WK; Future ? ?7. [redacted] weeks gestation of pregnancy ?8. Supervision of high risk pregnancy in first trimester ?- US OB Limited; Future ?- Cytology - PAP ?- Genetic Screening ?- Culture, OB Urine ?- CBC/D/Plt+RPR+Rh+ABO+RubIgG... ?- [place] MFM OB DETAIL +14 WK; Future ?- enoxaparin (LOVENOX) 40 MG/0.4ML injection; Inject 0.4 mLs (40 mg total) into the skin daily.  Dispense: 90 mL; Refill: 3 ? ? ?Initial labs drawn. ?Continue prenatal vitamins. ?Problem list reviewed and updated. ?Genetic Screening discussed, NIPS: ordered. ?Ultrasound discussed; fetal anatomic survey: ordered. ?Anticipatory guidance about prenatal visits given including labs, ultrasounds, and testing. ?Discussed usage of the Babyscripts app for more information about pregnancy, and to track blood pressures. ?Also discussed usage of virtual visits as additional source of managing and completing prenatal visits.   ?Patient was encouraged to use  MyChart to review results, send requests, and have questions addressed.   ?The nature of [place] [facility]/Faculty Practice with multiple MDs and Advanced Practice Providers was explained to patient; also emphasized that residents, students are part of our team. ?Routine obstetric precautions reviewed. Encouraged to seek out care at office or emergency room [facility] preferred) for urgent and/or emergent concerns. ?Return in about 4 weeks (around 06/08/2021) for OFFICE OB VISIT (MD or APP), possible AFP test.  ?  ? ? ?Verita Schneiders, MD, FACOG ?Obstetrician Social research officer, government, Faculty Practice ?[facility] [place] ?

## 2021-05-12 LAB — CBC/D/PLT+RPR+RH+ABO+RUBIGG...
Antibody Screen: NEGATIVE
Basophils Absolute: 0 10*3/uL (ref 0.0–0.2)
Basos: 1 %
EOS (ABSOLUTE): 0.2 10*3/uL (ref 0.0–0.4)
Eos: 2 %
HCV Ab: NONREACTIVE
HIV Screen 4th Generation wRfx: NONREACTIVE
Hematocrit: 34.2 % (ref 34.0–46.6)
Hemoglobin: 11.4 g/dL (ref 11.1–15.9)
Hepatitis B Surface Ag: NEGATIVE
Immature Grans (Abs): 0 10*3/uL (ref 0.0–0.1)
Immature Granulocytes: 0 %
Lymphocytes Absolute: 1 10*3/uL (ref 0.7–3.1)
Lymphs: 15 %
MCH: 26.4 pg — ABNORMAL LOW (ref 26.6–33.0)
MCHC: 33.3 g/dL (ref 31.5–35.7)
MCV: 79 fL (ref 79–97)
Monocytes Absolute: 0.5 10*3/uL (ref 0.1–0.9)
Monocytes: 7 %
Neutrophils Absolute: 5.3 10*3/uL (ref 1.4–7.0)
Neutrophils: 75 %
Platelets: 240 10*3/uL (ref 150–450)
RBC: 4.32 x10E6/uL (ref 3.77–5.28)
RDW: 12.9 % (ref 11.7–15.4)
RPR Ser Ql: NONREACTIVE
Rh Factor: POSITIVE
Rubella Antibodies, IGG: 2.07 index (ref >0.99)
WBC: 7.1 10*3/uL (ref 3.4–10.8)

## 2021-05-12 LAB — HEMOGLOBIN A1C
Est. average glucose Bld gHb Est-mCnc: 105 mg/dL
Hgb A1c MFr Bld: 5.3 % (ref 4.8–5.6)

## 2021-05-12 LAB — HCV INTERPRETATION

## 2021-05-13 LAB — CULTURE, OB URINE

## 2021-05-13 LAB — URINE CULTURE, OB REFLEX: Organism ID, Bacteria: NO GROWTH

## 2021-05-14 LAB — COMPREHENSIVE METABOLIC PANEL
ALT: 11 IU/L (ref 0–32)
AST: 15 IU/L (ref 0–40)
Albumin/Globulin Ratio: 1.4 (ref 1.2–2.2)
Albumin: 4.2 g/dL (ref 3.8–4.8)
Alkaline Phosphatase: 52 IU/L (ref 44–121)
BUN/Creatinine Ratio: 21 (ref 9–23)
BUN: 12 mg/dL (ref 6–20)
Bilirubin Total: 0.4 mg/dL (ref 0.0–1.2)
CO2: 21 mmol/L (ref 20–29)
Calcium: 9.4 mg/dL (ref 8.7–10.2)
Chloride: 100 mmol/L (ref 96–106)
Creatinine, Ser: 0.57 mg/dL (ref 0.57–1.00)
Globulin, Total: 3 g/dL (ref 1.5–4.5)
Glucose: 82 mg/dL (ref 70–99)
Potassium: 3.7 mmol/L (ref 3.5–5.2)
Sodium: 136 mmol/L (ref 134–144)
Total Protein: 7.2 g/dL (ref 6.0–8.5)
eGFR: 120 mL/min/{1.73_m2} (ref >59)

## 2021-05-14 LAB — TSH: TSH: 1.18 u[IU]/mL (ref 0.450–4.500)

## 2021-05-14 LAB — CYTOLOGY - PAP
Chlamydia: POSITIVE — AB
Comment: NEGATIVE
Comment: NEGATIVE
Comment: NORMAL
Diagnosis: NEGATIVE
High risk HPV: NEGATIVE
Neisseria Gonorrhea: NEGATIVE

## 2021-05-14 LAB — PROTEIN / CREATININE RATIO, URINE
Creatinine, Urine: 171.7 mg/dL
Protein, Ur: 24.4 mg/dL
Protein/Creat Ratio: 142 mg/g creat (ref 0–200)

## 2021-05-17 ENCOUNTER — Encounter: Payer: Self-pay | Admitting: Obstetrics & Gynecology

## 2021-05-17 ENCOUNTER — Telehealth: Payer: Self-pay

## 2021-05-17 DIAGNOSIS — A749 Chlamydial infection, unspecified: Secondary | ICD-10-CM

## 2021-05-17 HISTORY — DX: Chlamydial infection, unspecified: A74.9

## 2021-05-17 MED ORDER — AZITHROMYCIN 500 MG PO TABS: 1000.0000 mg | ORAL_TABLET | Freq: Once | ORAL | 1 refills | Status: AC

## 2021-05-17 NOTE — Progress Notes (Signed)
Patient has chlamydia.  Negative testing for other STIs.  She needs to let partner(s) know so the partner(s) can get testing and treatment. Patient and sex partner(s) should abstain from unprotected sexual activity for seven days after everyone receives appropriate treatment.  Azithromycin was prescribed for patient.  Patient will need to return in about 4 weeks after treatment for repeat test of cure.  Please call to inform patient of results and recommendations, and advise to pick up prescription and take as directed.  Please advise patient to practice safe sex at all times.  ? ?Of note, results were also released to MyChart and patient was given recommendations as indicated. ? ? ?Jaynie Collins, MD ? ? ? ?

## 2021-05-17 NOTE — Telephone Encounter (Signed)
TC to pt regarding recent results pt not ava voice mail is full.  ? ?

## 2021-05-17 NOTE — Addendum Note (Signed)
Addended by: Jaynie Collins A on: 05/17/2021 08:25 AM ? ? Modules accepted: Orders ? ?

## 2021-05-20 ENCOUNTER — Encounter: Payer: Self-pay | Admitting: *Deleted

## 2021-05-20 ENCOUNTER — Telehealth: Payer: Self-pay | Admitting: *Deleted

## 2021-05-20 NOTE — Telephone Encounter (Signed)
Pt informed of panorama results  ?

## 2021-05-24 ENCOUNTER — Encounter: Payer: Self-pay | Admitting: *Deleted

## 2021-05-24 ENCOUNTER — Encounter: Payer: Self-pay | Admitting: Obstetrics & Gynecology

## 2021-05-25 ENCOUNTER — Other Ambulatory Visit: Payer: Medicaid Other

## 2021-06-08 ENCOUNTER — Encounter: Payer: Medicaid Other | Admitting: Obstetrics & Gynecology

## 2021-06-15 ENCOUNTER — Other Ambulatory Visit (HOSPITAL_COMMUNITY)
Admission: RE | Admit: 2021-06-15 | Discharge: 2021-06-15 | Disposition: A | Discharge status: Home / Self Care | Payer: Medicaid Other | Source: Ambulatory Visit | Attending: Obstetrics & Gynecology | Admitting: Obstetrics & Gynecology

## 2021-06-15 ENCOUNTER — Ambulatory Visit (INDEPENDENT_AMBULATORY_CARE_PROVIDER_SITE_OTHER): Payer: Self-pay | Admitting: Obstetrics & Gynecology

## 2021-06-15 ENCOUNTER — Encounter: Payer: Self-pay | Admitting: Obstetrics & Gynecology

## 2021-06-15 VITALS — BP 116/73 | HR 92 | Wt 210.0 lb

## 2021-06-15 DIAGNOSIS — Z86718 Personal history of other venous thrombosis and embolism: Secondary | ICD-10-CM

## 2021-06-15 DIAGNOSIS — O98811 Other maternal infectious and parasitic diseases complicating pregnancy, first trimester: Secondary | ICD-10-CM | POA: Insufficient documentation

## 2021-06-15 DIAGNOSIS — Z3A18 18 weeks gestation of pregnancy: Secondary | ICD-10-CM

## 2021-06-15 DIAGNOSIS — O0991 Supervision of high risk pregnancy, unspecified, first trimester: Secondary | ICD-10-CM

## 2021-06-15 DIAGNOSIS — A749 Chlamydial infection, unspecified: Secondary | ICD-10-CM | POA: Diagnosis present

## 2021-06-15 DIAGNOSIS — O09299 Supervision of pregnancy with other poor reproductive or obstetric history, unspecified trimester: Secondary | ICD-10-CM

## 2021-06-15 DIAGNOSIS — O99210 Obesity complicating pregnancy, unspecified trimester: Secondary | ICD-10-CM

## 2021-06-15 DIAGNOSIS — Z8759 Personal history of other complications of pregnancy, childbirth and the puerperium: Secondary | ICD-10-CM

## 2021-06-15 NOTE — Progress Notes (Signed)
? ?  PRENATAL VISIT NOTE ? ?Subjective:  ?Nicole Berg is a 36 y.o. G3P2002 at [redacted]w[redacted]d being seen today for ongoing prenatal care.  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 ? ?Patient reports  occasional pain around her repaired umbilical hernia (done with mesh) .  Contractions: Not present. Vag. Bleeding: None.  Movement: Present. Denies leaking of fluid.  ? ?The following portions of the patient's history were reviewed and updated as appropriate: allergies, current medications, past family history, past medical history, past social history, past surgical history and problem list.  ? ?Objective:  ? ?Vitals:  ? 06/15/21 1434  ?BP: 116/73  ?Pulse: 92  ?Weight: 210 lb (95.3 kg)  ? ? ?Fetal Status: Fetal Heart Rate (bpm): 136   Movement: Present    ? ?General:  Alert, oriented and cooperative. Patient is in no acute distress.  ?Skin: Skin is warm and dry. No rash noted.   ?Cardiovascular: Normal heart rate noted  ?Respiratory: Normal respiratory effort, no problems with respiration noted  ?Abdomen: Soft, gravid, appropriate for gestational age.  Pain/Pressure: Present     ?Pelvic: Cervical exam deferred        ?Extremities: Normal range of motion.     ?Mental Status: Normal mood and affect. Normal behavior. Normal judgment and thought content.  ? ?Assessment and Plan:  ?Pregnancy: G3P2002 at [redacted]w[redacted]d ?1. History of maternal DVT (deep vein thrombosis) of right leg due to OCPs ?Unable to start prophylactic Lovenox due to insurance issues, case worker is working on this. In the meantime, told to wear compression stockings and continue ASA as  prescribed.  DVT/PE precautions advised. ? ?2. Chlamydia infection affecting pregnancy in first trimester ?Test of cure done today, will follow up results and manage accordingly. ?- Cervicovaginal ancillary only( [place]) ? ?3. History of pre-eclampsia in prior pregnancy, currently pregnant ?Stable BP. BP cuff given, advised to check weekly. Continue ASA. ? ?4. Obesity in pregnancy, antepartum ?TWG 13 lbs. ? ?5. [redacted] weeks gestation of pregnancy ?6. Supervision of high risk pregnancy in first trimester ?Low risk NIPS, anatomy scan already scheduled. ?- AFP, Serum, Open Spina Bifida ?No other complaints or concerns.  Routine obstetric precautions reviewed. ? ?Please refer to After Visit Summary for other counseling recommendations.  ? ?Return in about 4 weeks (around 07/13/2021) for OFFICE OB VISIT (MD only). ? ?Future Appointments  ?Date Time Provider [place]  ?06/22/2021  8:00 AM [facility]  ?07/06/2021  3:30 PM Aletha Halim, MD [facility]  ? ? ?Verita Schneiders, MD ? ? ?  ?

## 2021-06-16 LAB — AFP, SERUM, OPEN SPINA BIFIDA
AFP MoM: 1.3
AFP Value: 47.4 ng/mL
Gest. Age on Collection Date: 18 weeks
Maternal Age At EDD: 37.9 yr
OSBR Risk 1 IN: 4803
Test Results:: NEGATIVE
Weight: 210 [lb_av]

## 2021-06-17 LAB — CERVICOVAGINAL ANCILLARY ONLY
Chlamydia: NEGATIVE
Comment: NEGATIVE
Comment: NEGATIVE
Comment: NORMAL
Neisseria Gonorrhea: NEGATIVE
Trichomonas: NEGATIVE

## 2021-06-22 ENCOUNTER — Other Ambulatory Visit: Payer: Self-pay

## 2021-06-22 ENCOUNTER — Ambulatory Visit: Payer: Medicaid Other | Attending: Maternal & Fetal Medicine

## 2021-06-22 VITALS — BP 110/73 | HR 64 | Ht 66.0 in | Wt 213.5 lb

## 2021-06-22 DIAGNOSIS — O99211 Obesity complicating pregnancy, first trimester: Secondary | ICD-10-CM | POA: Diagnosis not present

## 2021-06-22 DIAGNOSIS — O09522 Supervision of elderly multigravida, second trimester: Secondary | ICD-10-CM

## 2021-06-22 DIAGNOSIS — O09521 Supervision of elderly multigravida, first trimester: Secondary | ICD-10-CM | POA: Insufficient documentation

## 2021-06-22 DIAGNOSIS — O0991 Supervision of high risk pregnancy, unspecified, first trimester: Secondary | ICD-10-CM | POA: Diagnosis present

## 2021-06-22 DIAGNOSIS — Z86718 Personal history of other venous thrombosis and embolism: Secondary | ICD-10-CM | POA: Diagnosis not present

## 2021-06-22 DIAGNOSIS — O09291 Supervision of pregnancy with other poor reproductive or obstetric history, first trimester: Secondary | ICD-10-CM | POA: Diagnosis not present

## 2021-06-22 DIAGNOSIS — Z3A00 Weeks of gestation of pregnancy not specified: Secondary | ICD-10-CM | POA: Insufficient documentation

## 2021-06-22 DIAGNOSIS — E669 Obesity, unspecified: Secondary | ICD-10-CM | POA: Insufficient documentation

## 2021-06-22 DIAGNOSIS — O26892 Other specified pregnancy related conditions, second trimester: Secondary | ICD-10-CM

## 2021-06-22 DIAGNOSIS — O09299 Supervision of pregnancy with other poor reproductive or obstetric history, unspecified trimester: Secondary | ICD-10-CM

## 2021-06-22 DIAGNOSIS — O09292 Supervision of pregnancy with other poor reproductive or obstetric history, second trimester: Secondary | ICD-10-CM

## 2021-06-22 DIAGNOSIS — Z3A19 19 weeks gestation of pregnancy: Secondary | ICD-10-CM

## 2021-07-06 ENCOUNTER — Ambulatory Visit (INDEPENDENT_AMBULATORY_CARE_PROVIDER_SITE_OTHER): Payer: Medicaid Other | Admitting: Obstetrics and Gynecology

## 2021-07-06 ENCOUNTER — Encounter: Payer: Self-pay | Admitting: Obstetrics and Gynecology

## 2021-07-06 VITALS — BP 105/70 | HR 91 | Wt 214.0 lb

## 2021-07-06 DIAGNOSIS — Z3A21 21 weeks gestation of pregnancy: Secondary | ICD-10-CM

## 2021-07-06 DIAGNOSIS — O0992 Supervision of high risk pregnancy, unspecified, second trimester: Secondary | ICD-10-CM

## 2021-07-06 DIAGNOSIS — O09299 Supervision of pregnancy with other poor reproductive or obstetric history, unspecified trimester: Secondary | ICD-10-CM

## 2021-07-06 DIAGNOSIS — O98811 Other maternal infectious and parasitic diseases complicating pregnancy, first trimester: Secondary | ICD-10-CM

## 2021-07-06 DIAGNOSIS — O09522 Supervision of elderly multigravida, second trimester: Secondary | ICD-10-CM

## 2021-07-06 DIAGNOSIS — Z8759 Personal history of other complications of pregnancy, childbirth and the puerperium: Secondary | ICD-10-CM

## 2021-07-06 DIAGNOSIS — O99210 Obesity complicating pregnancy, unspecified trimester: Secondary | ICD-10-CM

## 2021-07-06 DIAGNOSIS — Z6835 Body mass index (BMI) 35.0-35.9, adult: Secondary | ICD-10-CM

## 2021-07-06 DIAGNOSIS — Z86718 Personal history of other venous thrombosis and embolism: Secondary | ICD-10-CM

## 2021-07-06 DIAGNOSIS — A749 Chlamydial infection, unspecified: Secondary | ICD-10-CM

## 2021-07-06 NOTE — Progress Notes (Signed)
ROB [redacted]w[redacted]d   Pt still has not been able to get Lovenox Rx  CC:  Swelling while on feet at work pt has tried compression socks.

## 2021-07-06 NOTE — Progress Notes (Signed)
   PRENATAL VISIT NOTE  Subjective:  Nicole Berg is a 36 y.o. G3P2002 at [redacted]w[redacted]d being seen today for ongoing prenatal care.  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  Patient reports  b/l symmetric LE edema with prolonged standing .  Contractions: Not present. Vag. Bleeding: None.  Movement: Present. Denies leaking of fluid.   The following portions of the patient's history were reviewed and updated as appropriate: allergies, current medications, past family history, past medical history, past social history, past surgical history and problem list.   Objective:   Vitals:   07/06/21 1540  BP: 105/70  Pulse: 91  Weight: 214 lb (97.1 kg)    Fetal Status: Fetal Heart Rate (bpm): 154   Movement: Present     General:  Alert, oriented and cooperative. Patient is in no acute distress.  Skin: Skin is warm and dry. No rash noted.   Cardiovascular: Normal heart rate noted  Respiratory: Normal respiratory effort, no problems with respiration noted  Abdomen: Soft, gravid, appropriate for gestational age.  Pain/Pressure: Absent     Pelvic: Cervical exam deferred        Extremities: Normal range of motion.  Edema: Trace  Mental Status: Normal mood and affect. Normal behavior. Normal judgment and thought content.   Assessment and Plan:  Pregnancy: G3P2002 at [redacted]w[redacted]d 1. Multigravida of advanced maternal age in second trimester Has completion anatomy u/s already scheduled.  2. History of pre-eclampsia in prior pregnancy, currently pregnant Confirms on low dose asa  3. Maternal DVT (deep vein thrombosis), history  of With use of combined OCPs. Pt has called to switch over from [facility] to pregnancy medicaid as she thought our office was going to do it. I told her that she has to call and # given to patient to call Medicaid to do this. S/s of VTE d/w her and importance of starting the lovenox  4. Supervision of high risk pregnancy in second trimester  5. Obesity in pregnancy, antepartum  6. BMI 35.0-35.9,adult  7. Chlamydia infection affecting pregnancy in first trimester Toc neg  Preterm labor symptoms and general obstetric precautions including but not limited to vaginal bleeding, contractions, leaking of fluid and fetal movement were reviewed in detail with the patient. Please refer to After Visit Summary for other counseling recommendations.   No follow-ups on file.  Future Appointments  Date Time Provider [place]  07/20/2021  9:00 AM [facility]  08/10/2021  8:55 AM Anyanwu, Sallyanne Havers, MD [facility]  09/07/2021  9:35 AM Anyanwu, Sallyanne Havers, MD [facility]  09/21/2021  9:35 AM Aletha Halim, MD [facility]    Aletha Halim, MD

## 2021-07-15 ENCOUNTER — Other Ambulatory Visit: Payer: Self-pay

## 2021-07-15 DIAGNOSIS — Z86718 Personal history of other venous thrombosis and embolism: Secondary | ICD-10-CM

## 2021-07-15 DIAGNOSIS — O99210 Obesity complicating pregnancy, unspecified trimester: Secondary | ICD-10-CM

## 2021-07-15 DIAGNOSIS — O0992 Supervision of high risk pregnancy, unspecified, second trimester: Secondary | ICD-10-CM

## 2021-07-15 DIAGNOSIS — O09522 Supervision of elderly multigravida, second trimester: Secondary | ICD-10-CM

## 2021-07-15 DIAGNOSIS — O09299 Supervision of pregnancy with other poor reproductive or obstetric history, unspecified trimester: Secondary | ICD-10-CM

## 2021-07-20 ENCOUNTER — Ambulatory Visit: Payer: Medicaid Other | Attending: Obstetrics and Gynecology

## 2021-07-20 ENCOUNTER — Other Ambulatory Visit: Payer: Self-pay

## 2021-07-20 DIAGNOSIS — Z8759 Personal history of other complications of pregnancy, childbirth and the puerperium: Secondary | ICD-10-CM | POA: Insufficient documentation

## 2021-07-20 DIAGNOSIS — Z3A23 23 weeks gestation of pregnancy: Secondary | ICD-10-CM | POA: Diagnosis not present

## 2021-07-20 DIAGNOSIS — O0992 Supervision of high risk pregnancy, unspecified, second trimester: Secondary | ICD-10-CM | POA: Insufficient documentation

## 2021-07-20 DIAGNOSIS — Z362 Encounter for other antenatal screening follow-up: Secondary | ICD-10-CM | POA: Diagnosis not present

## 2021-07-20 DIAGNOSIS — Z86718 Personal history of other venous thrombosis and embolism: Secondary | ICD-10-CM | POA: Diagnosis not present

## 2021-07-20 DIAGNOSIS — O09522 Supervision of elderly multigravida, second trimester: Secondary | ICD-10-CM | POA: Insufficient documentation

## 2021-07-20 DIAGNOSIS — E669 Obesity, unspecified: Secondary | ICD-10-CM | POA: Insufficient documentation

## 2021-07-20 DIAGNOSIS — O99210 Obesity complicating pregnancy, unspecified trimester: Secondary | ICD-10-CM

## 2021-07-20 DIAGNOSIS — O09292 Supervision of pregnancy with other poor reproductive or obstetric history, second trimester: Secondary | ICD-10-CM | POA: Diagnosis not present

## 2021-07-20 DIAGNOSIS — O09299 Supervision of pregnancy with other poor reproductive or obstetric history, unspecified trimester: Secondary | ICD-10-CM

## 2021-07-20 DIAGNOSIS — O99212 Obesity complicating pregnancy, second trimester: Secondary | ICD-10-CM | POA: Diagnosis not present

## 2021-07-20 DIAGNOSIS — O26893 Other specified pregnancy related conditions, third trimester: Secondary | ICD-10-CM

## 2021-08-10 ENCOUNTER — Encounter: Payer: Self-pay | Admitting: Obstetrics & Gynecology

## 2021-08-10 ENCOUNTER — Ambulatory Visit (INDEPENDENT_AMBULATORY_CARE_PROVIDER_SITE_OTHER): Payer: Self-pay | Admitting: Obstetrics & Gynecology

## 2021-08-10 VITALS — BP 116/69 | HR 73 | Wt 218.8 lb

## 2021-08-10 DIAGNOSIS — O0992 Supervision of high risk pregnancy, unspecified, second trimester: Secondary | ICD-10-CM

## 2021-08-10 DIAGNOSIS — Z86718 Personal history of other venous thrombosis and embolism: Secondary | ICD-10-CM

## 2021-08-10 DIAGNOSIS — O09299 Supervision of pregnancy with other poor reproductive or obstetric history, unspecified trimester: Secondary | ICD-10-CM

## 2021-08-10 DIAGNOSIS — O99212 Obesity complicating pregnancy, second trimester: Secondary | ICD-10-CM

## 2021-08-10 DIAGNOSIS — Z3A26 26 weeks gestation of pregnancy: Secondary | ICD-10-CM

## 2021-08-10 DIAGNOSIS — O99012 Anemia complicating pregnancy, second trimester: Secondary | ICD-10-CM

## 2021-08-10 DIAGNOSIS — O99210 Obesity complicating pregnancy, unspecified trimester: Secondary | ICD-10-CM

## 2021-08-10 DIAGNOSIS — O09292 Supervision of pregnancy with other poor reproductive or obstetric history, second trimester: Secondary | ICD-10-CM

## 2021-08-10 DIAGNOSIS — O09522 Supervision of elderly multigravida, second trimester: Secondary | ICD-10-CM

## 2021-08-10 DIAGNOSIS — Z8759 Personal history of other complications of pregnancy, childbirth and the puerperium: Secondary | ICD-10-CM

## 2021-08-10 NOTE — Patient Instructions (Addendum)
Return to office for any scheduled appointments. Call the office or go to the MAU at [facility] if: You begin to have strong, frequent contractions Your water breaks.  Sometimes it is a big gush of fluid, sometimes it is just a trickle that keeps getting your underwear wet or running down your legs You have vaginal bleeding.  It is normal to have a small amount of spotting if your cervix was checked.  You do not feel your baby moving like normal.  If you do not, get something to eat and drink and lay down and focus on feeling your baby move.   If your baby is still not moving like normal, you should call the office or go to MAU. Any other obstetric concerns.   TDaP Vaccine Pregnancy Get the Whooping Cough Vaccine While You Are Pregnant (CDC)  It is important for women to get the whooping cough vaccine in the third trimester of each pregnancy. Vaccines are the best way to prevent this disease. There are 2 different whooping cough vaccines. Both vaccines combine protection against whooping cough, tetanus and diphtheria, but they are for different age groups: Tdap: for everyone 11 years or older, including pregnant women  DTaP: for children 2 months through 6 years of age  You need the whooping cough vaccine during each of your pregnancies The recommended time to get the shot is during your 27th through 36th week of pregnancy, preferably during the earlier part of this time period. The Centers for Disease Control and Prevention (CDC) recommends that pregnant women receive the whooping cough vaccine for adolescents and adults (called Tdap vaccine) during the third trimester of each pregnancy. The recommended time to get the shot is during your 27th through 36th week of pregnancy, preferably during the earlier part of this time period. This replaces the original recommendation that pregnant women get the vaccine only if they had not previously received it. The American  College of Obstetricians and Gynecologists and the American College of Nurse-Midwives support this recommendation.  You should get the whooping cough vaccine while pregnant to pass protection to your baby frame support disabled and/or not supported in this browser  Learn why Laura decided to get the whooping cough vaccine in her 3rd trimester of pregnancy and how her baby girl was born with some protection against the disease. Also available on YouTube. After receiving the whooping cough vaccine, your body will create protective antibodies (proteins produced by the body to fight off diseases) and pass some of them to your baby before birth. These antibodies provide your baby some short-term protection against whooping cough in early life. These antibodies can also protect your baby from some of the more serious complications that come along with whooping cough. Your protective antibodies are at their highest about 2 weeks after getting the vaccine, but it takes time to pass them to your baby. So the preferred time to get the whooping cough vaccine is early in your third trimester. The amount of whooping cough antibodies in your body decreases over time. That is why CDC recommends you get a whooping cough vaccine during each pregnancy. Doing so allows each of your babies to get the greatest number of protective antibodies from you. This means each of your babies will get the best protection possible against this disease.  Getting the whooping cough vaccine while pregnant is better than getting the vaccine after you give birth Whooping cough vaccination during pregnancy is ideal so   your baby will have short-term protection as soon as he is born. This early protection is important because your baby will not start getting his whooping cough vaccines until he is 2 months old. These first few months of life are when your baby is at greatest risk for catching whooping cough. This is also when he's at greatest  risk for having severe, potentially life-threating complications from the infection. To avoid that gap in protection, it is best to get a whooping cough vaccine during pregnancy. You will then pass protection to your baby before he is born. To continue protecting your baby, he should get whooping cough vaccines starting at 2 months old. You may never have gotten the Tdap vaccine before and did not get it during this pregnancy. If so, you should make sure to get the vaccine immediately after you give birth, before leaving the hospital or birthing center. It will take about 2 weeks before your body develops protection (antibodies) in response to the vaccine. Once you have protection from the vaccine, you are less likely to give whooping cough to your newborn while caring for him. But remember, your baby will still be at risk for catching whooping cough from others. A recent study looked to see how effective Tdap was at preventing whooping cough in babies whose mothers got the vaccine while pregnant or in the hospital after giving birth. The study found that getting Tdap between 27 through 36 weeks of pregnancy is 85% more effective at preventing whooping cough in babies younger than 2 months old. Blood tests cannot tell if you need a whooping cough vaccine There are no blood tests that can tell you if you have enough antibodies in your body to protect yourself or your baby against whooping cough. Even if you have been sick with whooping cough in the past or previously received the vaccine, you still should get the vaccine during each pregnancy. Breastfeeding may pass some protective antibodies onto your baby By breastfeeding, you may pass some antibodies you have made in response to the vaccine to your baby. When you get a whooping cough vaccine during your pregnancy, you will have antibodies in your breast milk that you can share with your baby as soon as your milk comes in. However, your baby will not get  protective antibodies immediately if you wait to get the whooping cough vaccine until after delivering your baby. This is because it takes about 2 weeks for your body to create antibodies. Learn more about the health benefits of breastfeeding.  

## 2021-08-10 NOTE — Progress Notes (Signed)
   PRENATAL VISIT NOTE  Subjective:  Nicole Berg is a 36 y.o. G3P2002 at [redacted]w[redacted]d being seen today for ongoing prenatal care.  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  Patient reports no complaints.  Contractions: Not present. Vag. Bleeding: None.  Movement: Present. Denies leaking of fluid.   The following portions of the patient's history were reviewed and updated as appropriate: allergies, current medications, past family history, past medical history, past social history, past surgical history and problem list.   Objective:   Vitals:   08/10/21 0905  BP: 116/69  Pulse: 73  Weight: 218 lb 12.8 oz (99.2 kg)    Fetal Status: Fetal Heart Rate (bpm): 137   Movement: Present     General:  Alert, oriented and cooperative. Patient is in no acute distress.  Skin: Skin is warm and dry. No rash noted.   Cardiovascular: Normal heart rate noted  Respiratory: Normal respiratory effort, no problems with respiration noted  Abdomen: Soft, gravid, appropriate for gestational age.  Pain/Pressure: Present     Pelvic: Cervical exam deferred        Extremities: Normal range of motion.  Edema: Trace  Mental Status: Normal mood and affect. Normal behavior. Normal judgment and thought content.   Assessment and Plan:  Pregnancy: G3P2002 at [redacted]w[redacted]d 1. 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 DVT/PE precautions advised.  2. History of pre-eclampsia in prior pregnancy, currently pregnant Normal BP today, continue ASA -  Comprehensive metabolic panel  3. Obesity in pregnancy, antepartum TWG 22 lbs, advised on improvement of diet and exercise habits to avoid excessive weight gain.  4. Multigravida of advanced maternal age in second trimester 5. [redacted] weeks gestation of pregnancy 6. Supervision of high risk pregnancy in second trimester Labs today, will follow up results and manage accordingly. - CBC - Glucose Tolerance, 2 Hours w/1 Hour - RPR - HIV Antibody (routine testing w rflx) Preterm labor symptoms and general obstetric precautions including but not limited to vaginal bleeding, contractions, leaking of fluid and fetal movement were reviewed in detail with the patient. Please refer to After Visit Summary for other counseling recommendations.   Return in about 4 weeks (around 09/07/2021) for TDap, OFFICE OB VISIT (MD only).  Future Appointments  Date Time Provider Department Center  09/07/2021  9:35 AM Preslee Regas, Jethro Bastos, MD [facility]  09/14/2021  2:00 PM [facility]  09/21/2021  9:35 AM [place] Bing, MD [facility]    Jaynie Collins, MD

## 2021-08-11 ENCOUNTER — Encounter: Payer: Self-pay | Admitting: Obstetrics & Gynecology

## 2021-08-11 DIAGNOSIS — O99012 Anemia complicating pregnancy, second trimester: Secondary | ICD-10-CM | POA: Insufficient documentation

## 2021-08-11 LAB — GLUCOSE TOLERANCE, 2 HOURS W/ 1HR
Glucose, 1 hour: 104 mg/dL (ref 70–179)
Glucose, 2 hour: 84 mg/dL (ref 70–152)
Glucose, Fasting: 81 mg/dL (ref 70–91)

## 2021-08-11 LAB — COMPREHENSIVE METABOLIC PANEL
ALT: 12 IU/L (ref 0–32)
AST: 14 IU/L (ref 0–40)
Albumin/Globulin Ratio: 1.2 (ref 1.2–2.2)
Albumin: 3.5 g/dL — ABNORMAL LOW (ref 3.9–4.9)
Alkaline Phosphatase: 71 IU/L (ref 44–121)
BUN/Creatinine Ratio: 17 (ref 9–23)
BUN: 9 mg/dL (ref 6–20)
Bilirubin Total: 0.2 mg/dL (ref 0.0–1.2)
CO2: 22 mmol/L (ref 20–29)
Calcium: 8.5 mg/dL — ABNORMAL LOW (ref 8.7–10.2)
Chloride: 104 mmol/L (ref 96–106)
Creatinine, Ser: 0.53 mg/dL — ABNORMAL LOW (ref 0.57–1.00)
Globulin, Total: 3 g/dL (ref 1.5–4.5)
Glucose: 88 mg/dL (ref 70–99)
Potassium: 4.1 mmol/L (ref 3.5–5.2)
Sodium: 138 mmol/L (ref 134–144)
Total Protein: 6.5 g/dL (ref 6.0–8.5)
eGFR: 122 mL/min/{1.73_m2} (ref >59)

## 2021-08-11 LAB — CBC
Hematocrit: 32.6 % — ABNORMAL LOW (ref 34.0–46.6)
Hemoglobin: 10.6 g/dL — ABNORMAL LOW (ref 11.1–15.9)
MCH: 26.9 pg (ref 26.6–33.0)
MCHC: 32.5 g/dL (ref 31.5–35.7)
MCV: 83 fL (ref 79–97)
Platelets: 199 10*3/uL (ref 150–450)
RBC: 3.94 x10E6/uL (ref 3.77–5.28)
RDW: 14.8 % (ref 11.7–15.4)
WBC: 7.1 10*3/uL (ref 3.4–10.8)

## 2021-08-11 LAB — HIV ANTIBODY (ROUTINE TESTING W REFLEX): HIV Screen 4th Generation wRfx: NONREACTIVE

## 2021-08-11 LAB — RPR: RPR Ser Ql: NONREACTIVE

## 2021-08-11 MED ORDER — FERROUS GLUCONATE 324 (38 FE) MG PO TABS: 324.0000 mg | ORAL_TABLET | Freq: Every day | ORAL | 3 refills | Status: DC

## 2021-08-11 NOTE — Addendum Note (Signed)
Addended by: Jaynie Collins A on: 08/11/2021 08:28 AM   Modules accepted: Orders

## 2021-09-07 ENCOUNTER — Ambulatory Visit (INDEPENDENT_AMBULATORY_CARE_PROVIDER_SITE_OTHER): Payer: Medicaid Other | Admitting: Obstetrics & Gynecology

## 2021-09-07 VITALS — BP 99/68 | HR 69 | Wt 228.0 lb

## 2021-09-07 DIAGNOSIS — O09293 Supervision of pregnancy with other poor reproductive or obstetric history, third trimester: Secondary | ICD-10-CM

## 2021-09-07 DIAGNOSIS — Z3A30 30 weeks gestation of pregnancy: Secondary | ICD-10-CM

## 2021-09-07 DIAGNOSIS — O99213 Obesity complicating pregnancy, third trimester: Secondary | ICD-10-CM

## 2021-09-07 DIAGNOSIS — Z8759 Personal history of other complications of pregnancy, childbirth and the puerperium: Secondary | ICD-10-CM

## 2021-09-07 DIAGNOSIS — O09522 Supervision of elderly multigravida, second trimester: Secondary | ICD-10-CM

## 2021-09-07 DIAGNOSIS — O99210 Obesity complicating pregnancy, unspecified trimester: Secondary | ICD-10-CM

## 2021-09-07 DIAGNOSIS — Z86718 Personal history of other venous thrombosis and embolism: Secondary | ICD-10-CM

## 2021-09-07 DIAGNOSIS — O0992 Supervision of high risk pregnancy, unspecified, second trimester: Secondary | ICD-10-CM

## 2021-09-07 DIAGNOSIS — O09299 Supervision of pregnancy with other poor reproductive or obstetric history, unspecified trimester: Secondary | ICD-10-CM

## 2021-09-07 DIAGNOSIS — O09523 Supervision of elderly multigravida, third trimester: Secondary | ICD-10-CM

## 2021-09-07 DIAGNOSIS — O0993 Supervision of high risk pregnancy, unspecified, third trimester: Secondary | ICD-10-CM

## 2021-09-07 NOTE — Progress Notes (Signed)
   PRENATAL VISIT NOTE  Subjective:  Nicole Berg is a 36 y.o. G3P2002 at [redacted]w[redacted]d being seen today for ongoing prenatal care.  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  Patient reports no complaints.  Contractions: Irritability. Vag. Bleeding: None.  Movement: Present. Denies leaking of fluid.   The following portions of the patient's history were reviewed and updated as appropriate: allergies, current medications, past family history, past medical history, past social history, past surgical history and problem list.   Objective:   Vitals:   09/07/21 0959  BP: 99/68  Pulse: 69  Weight: 228 lb (103.4 kg)    Fetal Status: Fetal Heart Rate (bpm): 143 Fundal Height: 33 cm Movement: Present     General:  Alert, oriented and cooperative. Patient is in no acute distress.  Skin: Skin is warm and dry. No rash noted.   Cardiovascular: Normal heart rate noted  Respiratory: Normal respiratory effort, no problems with respiration noted  Abdomen: Soft, gravid, appropriate for gestational age.  Pain/Pressure: Present     Pelvic: Cervical exam deferred        Extremities: Normal range of motion.  Edema: Deep pitting, indentation remains for a short time  Mental Status: Normal mood and affect. Normal behavior. Normal judgment and thought content.   Assessment and Plan:  Pregnancy: G3P2002 at [redacted]w[redacted]d 1. 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 DVT/PE precautions advised.  2. History of  pre-eclampsia in prior pregnancy, currently pregnant Normal BP today, continue ASA - Comprehensive metabolic panel  3. Obesity in pregnancy, antepartum TWG 22 lbs, advised on improvement of diet and exercise habits to avoid excessive weight gain.  4. Multigravida of advanced maternal age in second trimester 5. [redacted] weeks gestation of pregnancy 6. Supervision of high risk pregnancy in second trimester Normal third trimester labs Preterm labor symptoms and general obstetric precautions including but not limited to vaginal bleeding, contractions, leaking of fluid and fetal movement were reviewed in detail with the patient. Please refer to After Visit Summary for other counseling recommendations.   Return in about 2 weeks (around 09/21/2021).  Future Appointments  Date Time Provider Department Center  09/14/2021  1:00 PM [facility]  09/21/2021  9:35 AM Clarence Bing, MD [facility]    Jaynie Collins, MD

## 2021-09-09 ENCOUNTER — Other Ambulatory Visit: Payer: Self-pay

## 2021-09-09 DIAGNOSIS — O09299 Supervision of pregnancy with other poor reproductive or obstetric history, unspecified trimester: Secondary | ICD-10-CM

## 2021-09-09 DIAGNOSIS — O09523 Supervision of elderly multigravida, third trimester: Secondary | ICD-10-CM

## 2021-09-09 DIAGNOSIS — O2233 Deep phlebothrombosis in pregnancy, third trimester: Secondary | ICD-10-CM

## 2021-09-09 DIAGNOSIS — O99210 Obesity complicating pregnancy, unspecified trimester: Secondary | ICD-10-CM

## 2021-09-14 ENCOUNTER — Ambulatory Visit: Payer: Medicaid Other | Attending: Obstetrics and Gynecology

## 2021-09-14 ENCOUNTER — Other Ambulatory Visit: Payer: Self-pay

## 2021-09-14 DIAGNOSIS — O09523 Supervision of elderly multigravida, third trimester: Secondary | ICD-10-CM | POA: Diagnosis not present

## 2021-09-14 DIAGNOSIS — E669 Obesity, unspecified: Secondary | ICD-10-CM

## 2021-09-14 DIAGNOSIS — I82409 Acute embolism and thrombosis of unspecified deep veins of unspecified lower extremity: Secondary | ICD-10-CM | POA: Insufficient documentation

## 2021-09-14 DIAGNOSIS — O09293 Supervision of pregnancy with other poor reproductive or obstetric history, third trimester: Secondary | ICD-10-CM | POA: Insufficient documentation

## 2021-09-14 DIAGNOSIS — Z3A31 31 weeks gestation of pregnancy: Secondary | ICD-10-CM | POA: Insufficient documentation

## 2021-09-14 DIAGNOSIS — O09299 Supervision of pregnancy with other poor reproductive or obstetric history, unspecified trimester: Secondary | ICD-10-CM

## 2021-09-14 DIAGNOSIS — Z86718 Personal history of other venous thrombosis and embolism: Secondary | ICD-10-CM | POA: Diagnosis not present

## 2021-09-14 DIAGNOSIS — O99210 Obesity complicating pregnancy, unspecified trimester: Secondary | ICD-10-CM

## 2021-09-14 DIAGNOSIS — O2233 Deep phlebothrombosis in pregnancy, third trimester: Secondary | ICD-10-CM | POA: Diagnosis not present

## 2021-09-14 DIAGNOSIS — O99213 Obesity complicating pregnancy, third trimester: Secondary | ICD-10-CM | POA: Diagnosis present

## 2021-09-14 DIAGNOSIS — O99891 Other specified diseases and conditions complicating pregnancy: Secondary | ICD-10-CM | POA: Diagnosis not present

## 2021-09-21 ENCOUNTER — Encounter: Payer: Medicaid Other | Admitting: Obstetrics and Gynecology

## 2021-09-21 ENCOUNTER — Telehealth: Payer: Self-pay

## 2021-09-21 NOTE — Telephone Encounter (Signed)
Called pt per nurse to see if she was able to do a mychart OB visit. Left vm for pt to call office back regarding missed appt.

## 2021-10-07 ENCOUNTER — Ambulatory Visit (INDEPENDENT_AMBULATORY_CARE_PROVIDER_SITE_OTHER): Payer: Medicaid Other | Admitting: Advanced Practice Midwife

## 2021-10-07 VITALS — BP 105/71 | HR 67 | Wt 231.0 lb

## 2021-10-07 DIAGNOSIS — O09523 Supervision of elderly multigravida, third trimester: Secondary | ICD-10-CM

## 2021-10-07 DIAGNOSIS — Z8759 Personal history of other complications of pregnancy, childbirth and the puerperium: Secondary | ICD-10-CM

## 2021-10-07 DIAGNOSIS — O0993 Supervision of high risk pregnancy, unspecified, third trimester: Secondary | ICD-10-CM

## 2021-10-07 DIAGNOSIS — Z3A34 34 weeks gestation of pregnancy: Secondary | ICD-10-CM

## 2021-10-07 DIAGNOSIS — O99013 Anemia complicating pregnancy, third trimester: Secondary | ICD-10-CM

## 2021-10-07 DIAGNOSIS — Z86718 Personal history of other venous thrombosis and embolism: Secondary | ICD-10-CM

## 2021-10-07 DIAGNOSIS — Z880 Allergy status to penicillin: Secondary | ICD-10-CM

## 2021-10-07 DIAGNOSIS — O99012 Anemia complicating pregnancy, second trimester: Secondary | ICD-10-CM

## 2021-10-08 LAB — CBC
Hematocrit: 34.9 % (ref 34.0–46.6)
Hemoglobin: 11.8 g/dL (ref 11.1–15.9)
MCH: 27.8 pg (ref 26.6–33.0)
MCHC: 33.8 g/dL (ref 31.5–35.7)
MCV: 82 fL (ref 79–97)
Platelets: 186 10*3/uL (ref 150–450)
RBC: 4.24 x10E6/uL (ref 3.77–5.28)
RDW: 14.8 % (ref 11.7–15.4)
WBC: 8 10*3/uL (ref 3.4–10.8)
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
## 2021-10-14 ENCOUNTER — Other Ambulatory Visit: Payer: Self-pay

## 2021-10-14 DIAGNOSIS — O0993 Supervision of high risk pregnancy, unspecified, third trimester: Secondary | ICD-10-CM

## 2021-10-14 DIAGNOSIS — O2233 Deep phlebothrombosis in pregnancy, third trimester: Secondary | ICD-10-CM

## 2021-10-14 DIAGNOSIS — O09523 Supervision of elderly multigravida, third trimester: Secondary | ICD-10-CM

## 2021-10-14 DIAGNOSIS — O99210 Obesity complicating pregnancy, unspecified trimester: Secondary | ICD-10-CM

## 2021-10-18 ENCOUNTER — Encounter: Payer: Self-pay | Admitting: Obstetrics & Gynecology

## 2021-10-19 ENCOUNTER — Other Ambulatory Visit: Payer: Self-pay

## 2021-10-19 ENCOUNTER — Ambulatory Visit: Payer: Medicaid Other | Attending: Maternal & Fetal Medicine

## 2021-10-19 VITALS — BP 110/76 | HR 71 | Temp 98.3°F | Ht 66.0 in | Wt 234.0 lb

## 2021-10-19 DIAGNOSIS — Z86718 Personal history of other venous thrombosis and embolism: Secondary | ICD-10-CM | POA: Diagnosis not present

## 2021-10-19 DIAGNOSIS — O0993 Supervision of high risk pregnancy, unspecified, third trimester: Secondary | ICD-10-CM | POA: Diagnosis not present

## 2021-10-19 DIAGNOSIS — Z3A36 36 weeks gestation of pregnancy: Secondary | ICD-10-CM | POA: Insufficient documentation

## 2021-10-19 DIAGNOSIS — O2233 Deep phlebothrombosis in pregnancy, third trimester: Secondary | ICD-10-CM

## 2021-10-19 DIAGNOSIS — O99012 Anemia complicating pregnancy, second trimester: Secondary | ICD-10-CM

## 2021-10-19 DIAGNOSIS — O99215 Obesity complicating the puerperium: Secondary | ICD-10-CM | POA: Insufficient documentation

## 2021-10-19 DIAGNOSIS — O09523 Supervision of elderly multigravida, third trimester: Secondary | ICD-10-CM | POA: Diagnosis not present

## 2021-10-19 DIAGNOSIS — O09293 Supervision of pregnancy with other poor reproductive or obstetric history, third trimester: Secondary | ICD-10-CM | POA: Insufficient documentation

## 2021-10-19 DIAGNOSIS — E669 Obesity, unspecified: Secondary | ICD-10-CM

## 2021-10-19 DIAGNOSIS — O1493 Unspecified pre-eclampsia, third trimester: Secondary | ICD-10-CM | POA: Diagnosis not present

## 2021-10-19 DIAGNOSIS — O09299 Supervision of pregnancy with other poor reproductive or obstetric history, unspecified trimester: Secondary | ICD-10-CM

## 2021-10-19 DIAGNOSIS — O99210 Obesity complicating pregnancy, unspecified trimester: Secondary | ICD-10-CM

## 2021-10-21 ENCOUNTER — Other Ambulatory Visit (HOSPITAL_COMMUNITY)
Admission: RE | Admit: 2021-10-21 | Discharge: 2021-10-21 | Disposition: A | Discharge status: Home / Self Care | Payer: Medicaid Other | Source: Ambulatory Visit | Attending: Advanced Practice Midwife | Admitting: Advanced Practice Midwife

## 2021-10-21 ENCOUNTER — Telehealth: Payer: Self-pay

## 2021-10-21 ENCOUNTER — Ambulatory Visit (INDEPENDENT_AMBULATORY_CARE_PROVIDER_SITE_OTHER): Payer: Medicaid Other | Admitting: Advanced Practice Midwife

## 2021-10-21 VITALS — BP 120/79 | HR 87 | Wt 234.0 lb

## 2021-10-21 DIAGNOSIS — O09299 Supervision of pregnancy with other poor reproductive or obstetric history, unspecified trimester: Secondary | ICD-10-CM

## 2021-10-21 DIAGNOSIS — O0990 Supervision of high risk pregnancy, unspecified, unspecified trimester: Secondary | ICD-10-CM | POA: Insufficient documentation

## 2021-10-21 DIAGNOSIS — Z3A36 36 weeks gestation of pregnancy: Secondary | ICD-10-CM

## 2021-10-21 DIAGNOSIS — Z8759 Personal history of other complications of pregnancy, childbirth and the puerperium: Secondary | ICD-10-CM

## 2021-10-21 DIAGNOSIS — O99210 Obesity complicating pregnancy, unspecified trimester: Secondary | ICD-10-CM

## 2021-10-21 DIAGNOSIS — O0993 Supervision of high risk pregnancy, unspecified, third trimester: Secondary | ICD-10-CM

## 2021-10-21 DIAGNOSIS — O09293 Supervision of pregnancy with other poor reproductive or obstetric history, third trimester: Secondary | ICD-10-CM

## 2021-10-21 DIAGNOSIS — O09523 Supervision of elderly multigravida, third trimester: Secondary | ICD-10-CM | POA: Diagnosis not present

## 2021-10-21 DIAGNOSIS — Z86718 Personal history of other venous thrombosis and embolism: Secondary | ICD-10-CM | POA: Diagnosis not present

## 2021-10-21 DIAGNOSIS — O99213 Obesity complicating pregnancy, third trimester: Secondary | ICD-10-CM

## 2021-10-21 DIAGNOSIS — Z880 Allergy status to penicillin: Secondary | ICD-10-CM

## 2021-10-21 NOTE — Telephone Encounter (Signed)
Pt called regarding spotting after cervical exam.  I let patient know this can happen however to monitor sx's.  Discussed labor precautions.  Pt voiced understanding.

## 2021-10-21 NOTE — Progress Notes (Addendum)
   PRENATAL VISIT NOTE  Subjective:  Nicole Berg is a 36 y.o. G3P2002 at [redacted]w[redacted]d being seen today for ongoing prenatal care.  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  Patient reports  Braxton Hicks contractions .  Contractions: Irregular. Vag. Bleeding: None.  Movement: Present. Denies leaking of fluid.   The following portions of the patient's history were reviewed and updated as appropriate: allergies, current medications, past family history, past medical history, past social history, past surgical history and problem list. Problem list updated.  Objective:   Vitals:   10/21/21 1019  BP: 120/79  Pulse: 87  Weight: 234 lb (106.1 kg)    Fetal Status: Fetal Heart Rate (bpm): 142 Fundal Height: 36 cm Movement: Present  Presentation: Vertex  General:  Alert, oriented and cooperative. Patient is in no acute distress.  Skin: Skin is warm and dry. No rash noted.   Cardiovascular: Normal heart rate noted  Respiratory: Normal respiratory effort, no problems with respiration noted  Abdomen: Soft, gravid, appropriate for gestational age.  Pain/Pressure: Present     Pelvic: Cervical exam performed Dilation: 2.5 Effacement (%): Thick Station: Ballotable  Extremities: Normal range of motion.  Edema: Trace  Mental Status: Normal mood and affect. Normal behavior. Normal judgment and thought content.   Assessment and Plan:  Pregnancy: G3P2002 at [redacted]w[redacted]d  1. Supervision of high risk pregnancy in third trimester - Routine care - Previous births at 72 and 37  weeks - Discussed delivery timing and IOL during 39th week  - Continue IOL discussion next visit - Cervicovaginal ancillary only - Strep Gp B Culture+Rflx  2. [redacted] weeks gestation of pregnancy   3. AMA (advanced maternal age) multigravida 12+, third trimester   4. 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 Penicillin allergy - GBS with sensitivities  6. History of pre-eclampsia in prior pregnancy, currently pregnant - normotensive in current pregnancy  7. Obesity in pregnancy, antepartum - Pregravid BMI 31.65, FH appropriate, TWG 38 lbs  Preterm labor symptoms and general obstetric precautions including but not limited to vaginal bleeding, contractions, leaking of fluid and fetal movement were reviewed in detail with the patient. Please refer to After Visit Summary for other counseling recommendations.  Return in about 1 week (around 10/28/2021).  Future Appointments  Date Time Provider [place]  10/27/2021 10:35 AM Donnamae Jude, MD [facility]  11/02/2021 10:35 AM Aletha Halim, MD [facility]  11/10/2021 10:55 AM Donnamae Jude, MD [facility]  11/17/2021 10:15 AM Donnamae Jude, MD [facility]    Darlina Rumpf, CNM

## 2021-10-22 LAB — CERVICOVAGINAL ANCILLARY ONLY
Chlamydia: NEGATIVE
Comment: NEGATIVE
Comment: NORMAL
Neisseria Gonorrhea: NEGATIVE

## 2021-10-25 LAB — STREP GP B SUSCEPTIBILITY

## 2021-10-25 LAB — STREP GP B CULTURE+RFLX: Strep Gp B Culture+Rflx: POSITIVE — AB

## 2021-10-26 ENCOUNTER — Encounter: Payer: Self-pay | Admitting: Advanced Practice Midwife

## 2021-10-26 DIAGNOSIS — B951 Streptococcus, group B, as the cause of diseases classified elsewhere: Secondary | ICD-10-CM | POA: Insufficient documentation

## 2021-10-27 ENCOUNTER — Encounter: Payer: Medicaid Other | Admitting: Family Medicine

## 2021-11-02 ENCOUNTER — Encounter: Payer: Medicaid Other | Admitting: Obstetrics and Gynecology

## 2021-11-04 ENCOUNTER — Encounter: Payer: Self-pay | Admitting: Obstetrics & Gynecology

## 2021-11-10 ENCOUNTER — Encounter: Payer: Medicaid Other | Admitting: Family Medicine
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
## 2021-11-15 ENCOUNTER — Telehealth: Payer: Self-pay

## 2021-11-15 ENCOUNTER — Other Ambulatory Visit: Payer: Self-pay | Admitting: *Deleted

## 2021-11-15 MED ORDER — CEPHALEXIN 500 MG PO CAPS: 500.0000 mg | ORAL_CAPSULE | Freq: Three times a day (TID) | ORAL | 0 refills | Status: DC

## 2021-11-15 NOTE — Telephone Encounter (Signed)
Rx was sent for Keflex upon review. Clinical staff called the patient

## 2021-11-15 NOTE — Telephone Encounter (Signed)
Return call to pt regarding triage call for breast pain and redness possible mastitis  Consulted with provider in the office Rx will be sent to [facility] in [place].  Pt voiced understanding.

## 2021-11-17 ENCOUNTER — Encounter: Payer: Medicaid Other | Admitting: Family Medicine

## 2021-11-30 ENCOUNTER — Encounter: Payer: Self-pay | Admitting: *Deleted

## 2021-12-02 ENCOUNTER — Encounter: Payer: Self-pay | Admitting: Advanced Practice Midwife

## 2021-12-02 ENCOUNTER — Ambulatory Visit (INDEPENDENT_AMBULATORY_CARE_PROVIDER_SITE_OTHER): Payer: Medicaid Other | Admitting: Advanced Practice Midwife

## 2021-12-02 DIAGNOSIS — Z86718 Personal history of other venous thrombosis and embolism: Secondary | ICD-10-CM | POA: Diagnosis not present

## 2021-12-02 DIAGNOSIS — N61 Mastitis without abscess: Secondary | ICD-10-CM

## 2021-12-02 DIAGNOSIS — Z30011 Encounter for initial prescription of contraceptive pills: Secondary | ICD-10-CM

## 2021-12-02 DIAGNOSIS — Z8759 Personal history of other complications of pregnancy, childbirth and the puerperium: Secondary | ICD-10-CM

## 2021-12-02 MED ORDER — NORETHINDRONE 0.35 MG PO TABS: 1.0000 | ORAL_TABLET | Freq: Every day | ORAL | 11 refills | Status: DC

## 2021-12-02 NOTE — Progress Notes (Signed)
Post Partum Visit Note  Nicole Berg is a 36 y.o. G33P2002 female who presents for a postpartum visit. She is 5 weeks postpartum following a normal spontaneous vaginal delivery.  I have fully reviewed the prenatal and intrapartum course. The delivery was at 36 gestational weeks.  Anesthesia: none. Postpartum course has been unremarkable. Baby is doing well. Baby is feeding by bottle - Similac Advance. Bleeding no bleeding. Bowel function is normal. Bladder function is normal. Patient is sexually active. Contraception method is none. Postpartum depression screening: negative.  Patient reports that her mastitis is better but she has noticed a big change in her volume. Patient reports pumping 4-6 times per day and has also tried other remedies but is concerned about her ability to feel her baby.  Patient states she is not taking SubQ Lovenox. She also declined in pregnancy.  The pregnancy intention screening data noted above was reviewed. Potential methods of contraception were discussed. The patient elected to proceed with POPs    Health Maintenance Due  Topic Date Due   COVID-19 Vaccine (1) Never done   INFLUENZA VACCINE  08/31/2021    The following portions of the patient's history were reviewed and updated as appropriate: allergies, current medications, past family history, past medical history, past social history, past surgical history, and problem list.  Review of Systems Pertinent items are noted in HPI.  Objective:  There were no vitals taken for this visit.   General:  alert, cooperative, appears stated age, and no distress   Breasts:  normal  Lungs: Normal WOB  Heart:  Normal pulse  Abdomen: soft, non-tender; bowel sounds normal; no masses,  no organomegaly   GU exam:  not indicated       Assessment:   1. Normal Postpartum exam 2. Encouraged pumping q 2-3 hours 3. Patient delivered at [facility] and has active referral to [facility]. Encouraged to schedule ASAP 4. Initiate  POPs  Plan:   Essential components of care per ACOG recommendations:  1.  Mood and well being: Patient with negative depression screening today. Reviewed local resources for support.  - Patient tobacco use? No.   - hx of drug use? No.    2. Infant care and feeding:  -Patient currently breastmilk feeding? Yes. Discussed returning to work and pumping. Reviewed importance of draining breast regularly to support lactation.  -Social determinants of health (SDOH) reviewed in EPIC. No concerns  3. Sexuality, contraception and birth spacing - Patient does not want a pregnancy in the next year.  - Reviewed reproductive life planning. Reviewed contraceptive methods based on pt preferences and effectiveness and hx of DVT.  Patient desired POPs today.   - Discussed birth spacing of 18 months  4. Sleep and fatigue -Encouraged family/partner/community support of 4 hrs of uninterrupted sleep to help with mood and fatigue  5. Physical Recovery  - Discussed patients delivery and complications. She describes her labor as good. - Patient had a Vaginal, no problems at delivery. Perineal healing reviewed. Patient expressed understanding - Patient has urinary incontinence? No. - Patient is safe to resume physical and sexual activity  6.  Health Maintenance - HM due items addressed Yes - Last pap smear   Diagnosis  Date Value Ref Range Status  05/11/2021   Final   - Negative for intraepithelial lesion or malignancy (NILM)   Pap smear not done at today's visit.  -Breast Cancer screening indicated? No.   7. Chronic Disease/Pregnancy Condition follow up: None  - PCP  follow up  Mallie Snooks, [place], MSN, CNM Certified Nurse Midwife, [facility] for [employer], [place]

## 2022-05-14 ENCOUNTER — Other Ambulatory Visit: Payer: Self-pay

## 2022-05-14 ENCOUNTER — Emergency Department
Admission: EM | Admit: 2022-05-14 | Discharge: 2022-05-15 | Disposition: A | Discharge status: Home / Self Care | Payer: Medicaid Other | Attending: Emergency Medicine | Admitting: Emergency Medicine

## 2022-05-14 ENCOUNTER — Encounter: Payer: Self-pay | Admitting: Emergency Medicine

## 2022-05-14 ENCOUNTER — Emergency Department: Payer: Medicaid Other

## 2022-05-14 DIAGNOSIS — O468X2 Other antepartum hemorrhage, second trimester: Secondary | ICD-10-CM

## 2022-05-14 DIAGNOSIS — Z3A14 14 weeks gestation of pregnancy: Secondary | ICD-10-CM | POA: Insufficient documentation

## 2022-05-14 DIAGNOSIS — O418X20 Other specified disorders of amniotic fluid and membranes, second trimester, not applicable or unspecified: Secondary | ICD-10-CM | POA: Diagnosis not present

## 2022-05-14 DIAGNOSIS — O200 Threatened abortion: Secondary | ICD-10-CM | POA: Insufficient documentation

## 2022-05-14 DIAGNOSIS — O4690 Antepartum hemorrhage, unspecified, unspecified trimester: Secondary | ICD-10-CM

## 2022-05-14 DIAGNOSIS — Z7901 Long term (current) use of anticoagulants: Secondary | ICD-10-CM | POA: Insufficient documentation

## 2022-05-14 DIAGNOSIS — Z9229 Personal history of other drug therapy: Secondary | ICD-10-CM

## 2022-05-14 DIAGNOSIS — O209 Hemorrhage in early pregnancy, unspecified: Secondary | ICD-10-CM | POA: Diagnosis present

## 2022-05-14 LAB — COMPREHENSIVE METABOLIC PANEL
ALT: 22 U/L (ref 0–44)
AST: 22 U/L (ref 15–41)
Albumin: 4 g/dL (ref 3.5–5.0)
Alkaline Phosphatase: 44 U/L (ref 38–126)
Anion gap: 7 (ref 5–15)
BUN: 14 mg/dL (ref 6–20)
CO2: 21 mmol/L — ABNORMAL LOW (ref 22–32)
Calcium: 9.2 mg/dL (ref 8.9–10.3)
Chloride: 107 mmol/L (ref 98–111)
Creatinine, Ser: 0.53 mg/dL (ref 0.44–1.00)
GFR, Estimated: >60 mL/min (ref >60)
Glucose, Bld: 94 mg/dL (ref 70–99)
Potassium: 3.5 mmol/L (ref 3.5–5.1)
Sodium: 135 mmol/L (ref 135–145)
Total Bilirubin: 0.4 mg/dL (ref 0.3–1.2)
Total Protein: 7.9 g/dL (ref 6.5–8.1)

## 2022-05-14 LAB — CBC WITH DIFFERENTIAL/PLATELET
Abs Immature Granulocytes: 0.01 10*3/uL (ref 0.00–0.07)
Basophils Absolute: 0.1 10*3/uL (ref 0.0–0.1)
Basophils Relative: 1 %
Eosinophils Absolute: 0.1 10*3/uL (ref 0.0–0.5)
Eosinophils Relative: 2 %
HCT: 35.3 % — ABNORMAL LOW (ref 36.0–46.0)
Hemoglobin: 11.5 g/dL — ABNORMAL LOW (ref 12.0–15.0)
Immature Granulocytes: 0 %
Lymphocytes Relative: 32 %
Lymphs Abs: 1.9 10*3/uL (ref 0.7–4.0)
MCH: 25.1 pg — ABNORMAL LOW (ref 26.0–34.0)
MCHC: 32.6 g/dL (ref 30.0–36.0)
MCV: 77.1 fL — ABNORMAL LOW (ref 80.0–100.0)
Monocytes Absolute: 0.5 10*3/uL (ref 0.1–1.0)
Monocytes Relative: 8 %
Neutro Abs: 3.3 10*3/uL (ref 1.7–7.7)
Neutrophils Relative %: 57 %
Platelets: 193 10*3/uL (ref 150–400)
RBC: 4.58 MIL/uL (ref 3.87–5.11)
RDW: 15.1 % (ref 11.5–15.5)
WBC: 5.8 10*3/uL (ref 4.0–10.5)
nRBC: 0 % (ref 0.0–0.2)

## 2022-05-14 LAB — TYPE AND SCREEN
ABO/RH(D): A POS
Antibody Screen: NEGATIVE

## 2022-05-14 LAB — URINALYSIS, W/ REFLEX TO CULTURE (INFECTION SUSPECTED)
Bilirubin Urine: NEGATIVE
Glucose, UA: NEGATIVE mg/dL
Ketones, ur: NEGATIVE mg/dL
Nitrite: NEGATIVE
Protein, ur: NEGATIVE mg/dL
RBC / HPF: >50 RBC/hpf (ref 0–5)
Specific Gravity, Urine: 1.021 (ref 1.005–1.030)
pH: 5 (ref 5.0–8.0)

## 2022-05-14 LAB — HCG, QUANTITATIVE, PREGNANCY: hCG, Beta Chain, Quant, S: 154961 m[IU]/mL — ABNORMAL HIGH (ref <5)

## 2022-05-14 MED ORDER — ONDANSETRON HCL 4 MG/2ML IJ SOLN
INTRAMUSCULAR | Status: AC
  Filled 2022-05-14: qty 2

## 2022-05-14 MED ORDER — SODIUM CHLORIDE 0.9 % IV BOLUS
1000.0000 mL | Freq: Once | INTRAVENOUS | Status: AC
  Administered 2022-05-14: 1000 mL via INTRAVENOUS

## 2022-05-14 NOTE — ED Triage Notes (Signed)
Pt to ED via POV with c/o vaginal bleeding. Pt states is approdx 12-[redacted] weeks pregnant. Pt vaginal bleeding started approx 1 hr PTA. Pt states is unsure if she is passing clots, states she went to the bathroom and was "bleeding everywhere". Pt states is currently on lovenox injections 100mg  BID for a DVT to LLE.

## 2022-05-14 NOTE — ED Provider Notes (Signed)
[facility] Provider Note    Event Date/Time   First MD Initiated Contact with Patient 05/14/22 2027     (approximate)   History   Vaginal Bleeding   HPI  Nicole Berg is a 37 y.o. female   Past medical history of approximately [redacted] weeks pregnant who presents to the emergency department with vaginal bleeding sudden onset this evening while straining to make a bowel movement.  No preceding illnesses or bleeding nor trauma.  Of note she is on Lovenox for DVT during this pregnancy.  She has been fully compliant with this medication and has not been complicated by bleeding thus far.  She denies rectal bleeding.  She denies pain.  She soaked through approximately 4-5 pads prior to coming to the emergency department after which she says the bleeding feels like it slowed down.  She has not yet had an ultrasound during this pregnancy.  This was a natural conception.  External Medical Documents Reviewed: Hematology office visit from 04/21/2022 documenting DVT and Lovenox prescriptions      Physical Exam   Triage Vital Signs: ED Triage Vitals  Enc Vitals Group     BP 05/14/22 2012 (!) 86/58     Pulse Rate 05/14/22 2012 98     Resp 05/14/22 2012 19     Temp 05/14/22 2012 97.6 F (36.4 C)     Temp Source 05/14/22 2012 Oral     SpO2 05/14/22 2012 95 %     Weight 05/14/22 2009 222 lb 11.2 oz (101 kg)     Height 05/14/22 2009 5\' 6"  (1.676 m)     Head Circumference --      Peak Flow --      Pain Score 05/14/22 2009 0     Pain Loc --      Pain Edu? --      Excl. in GC? --     Most recent vital signs: Vitals:   05/14/22 2300 05/15/22 0000  BP: 100/62 (!) 96/59  Pulse: 78 70  Resp:    Temp:    SpO2: 100% 99%    General: Awake, no distress.  CV:  Good peripheral perfusion.  Resp:  Normal effort.  Abd:  No distention.  Other:  Soft nontender abdomen.  Borderline hypotensive 90s over 60s, pulse rate in the 50s, no acute distress, skin appears warm  well-perfused, awake alert pleasant comfortable - pelvic exam shows some minimal blood and small blood clots in the vaginal vault but no active bleeding   ED Results / Procedures / Treatments   Labs (all labs ordered are listed, but only abnormal results are displayed) Labs Reviewed  CBC WITH DIFFERENTIAL/PLATELET - Abnormal; Notable for the following components:      Result Value   Hemoglobin 11.5 (*)    HCT 35.3 (*)    MCV 77.1 (*)    MCH 25.1 (*)    All other components within normal limits  COMPREHENSIVE METABOLIC PANEL - Abnormal; Notable for the following components:   CO2 21 (*)    All other components within normal limits  HCG, QUANTITATIVE, PREGNANCY - Abnormal; Notable for the following components:   hCG, Beta Chain, Quant, S 154,961 (*)    All other components within normal limits  URINALYSIS, W/ REFLEX TO CULTURE (INFECTION SUSPECTED) - Abnormal; Notable for the following components:   Color, Urine YELLOW (*)    APPearance HAZY (*)    Hgb urine dipstick LARGE (*)    Leukocytes,Ua  SMALL (*)    Bacteria, UA RARE (*)    All other components within normal limits  URINE CULTURE  CBC  POC URINE PREG, ED  TYPE AND SCREEN     I ordered and reviewed the above labs they are notable for hemoglobin is 11.5 from a previous of 11.8    PROCEDURES:  Critical Care performed: No  Procedures   MEDICATIONS ORDERED IN ED: Medications  sodium chloride 0.9 % bolus 1,000 mL (0 mLs Intravenous Stopped 05/14/22 2200)  ondansetron (ZOFRAN) 4 MG/2ML injection (  Given 05/14/22 2100)     IMPRESSION / MDM / ASSESSMENT AND PLAN / ED COURSE  I reviewed the triage vital signs and the nursing notes.                                Patient's presentation is most consistent with acute presentation with potential threat to life or bodily function.  Differential diagnosis includes, but is not limited to, ectopic pregnancy, miscarriage, acute blood loss anemia   The patient is on  the cardiac monitor to evaluate for evidence of arrhythmia and/or significant heart rate changes.  MDM: This is a patient on blood thinners with bleeding in approximately 14 weeks of pregnancy with no previous ultrasound finding, pregnancy of unknown location at this time must rule out ectopic pregnancy.  Fortunately no pain, bleeding has decreased significantly since being in the emergency department with no active bleeding on my pelvic exam.  Transvaginal ultrasound is ordered.  Patient blood pressure has remained stable with maps well above 65.  Hemoglobin is normal, albeit with acute bleeding this may not represent the extent of blood loss at this time.  I see no emergent rationale to reverse her anticoagulation at this point which is necessary for treating her DVT as her bleeding has slowed significantly I see no active signs of bleeding her vital signs been normal and her hemoglobin is normal.  --- Patient remained stable with blood pressure still borderline low but unchanged.  No further bleeding.  Transvaginal ultrasound formal read is pending but I reviewed the images and see a single intrauterine pregnancy, doubt heterotopic given natural conception.  Since she is on Lovenox and had a profuse bleeding with an episode of hypotension I think she is best served to be observed in the hospital for repeat H&H, monitoring her for recurrence of bleeding.       FINAL CLINICAL IMPRESSION(S) / ED DIAGNOSES   Final diagnoses:  Vaginal bleeding in pregnancy     Rx / DC Orders   ED Discharge Orders     None        Note:  This document was prepared using Dragon voice recognition software and may include unintentional dictation errors.    Pilar Jarvis, MD 05/14/22 1324    Pilar Jarvis, MD 05/15/22 [phone]

## 2022-05-15 LAB — CBC
HCT: 31 % — ABNORMAL LOW (ref 36.0–46.0)
Hemoglobin: 9.8 g/dL — ABNORMAL LOW (ref 12.0–15.0)
MCH: 24.9 pg — ABNORMAL LOW (ref 26.0–34.0)
MCHC: 31.6 g/dL (ref 30.0–36.0)
MCV: 78.9 fL — ABNORMAL LOW (ref 80.0–100.0)
Platelets: 164 10*3/uL (ref 150–400)
RBC: 3.93 MIL/uL (ref 3.87–5.11)
RDW: 15.2 % (ref 11.5–15.5)
WBC: 5.5 10*3/uL (ref 4.0–10.5)
nRBC: 0 % (ref 0.0–0.2)

## 2022-05-15 LAB — URINE CULTURE

## 2022-05-15 MED ORDER — LACTATED RINGERS IV BOLUS
1000.0000 mL | Freq: Once | INTRAVENOUS | Status: AC
  Administered 2022-05-15: 1000 mL via INTRAVENOUS

## 2022-05-15 NOTE — ED Provider Notes (Signed)
-----------------------------------------   1:53 AM on 05/15/2022 -----------------------------------------  Assumed care of patient from Nicole Berg at about 12:30am.  Plan is for admission to hospitalist service for vaginal bleeding at [redacted] weeks gestation in the setting of anticoagulation for DVT.   Clinical Course as of 05/15/22 0258  Nicole Berg May 15, 2022  0125 I spoke with Nicole Berg with the hospitalist service by phone.  She expressed concern that this is an OB/GYN issue and not a hospitalist issue, which I can understand.  I am consulting the OB/GYN service to discuss the case and asked them to admit the patient [CF]  0154 I consulted by phone with Nicole Berg, CNM with the OB/GYN team.  We discussed the case and she agreed to admit the patient.  I discontinued the consult order for the hospitalist team and let Nicole Berg know that she would not need to admit the patient. [CF]  0156 I just received a call back from Nicole Berg.  She spoke with Nicole Berg who feels that we should recheck a CBC, and if the hemoglobin has not dropped substantially, she does not feel the patient would need to be admitted (assuming she is still not actively bleeding).  We will send a new CBC and give another liter of fluids and reassess. [CF]  0310 CBC shows a new hemoglobin of 9.8, but the patient also received a liter of fluid in the meantime and there is certainly going to be a dilutional effect.  It is clear that the patient has lost some blood but somewhere less than enough to cause a 1.7 point drop in hemoglobin.  I spoke again with the certified nurse midwife Nicole Berg.  If the patient remains asymptomatic and her blood pressure remained stable or improved, the position of OB service is that the patient can go home to follow-up as an outpatient with her obstetrician at [facility].  If she has had more bleeding or other symptoms, I will again contact the service to discuss admission. [CF]  0331 Second bag of fluids is  almost complete.  Patient's blood pressure remains low normal but stable.  She has been resting.  She says she feels fine and has had no additional bleeding after about 8 hours in the emergency department.  She has no pain.  We had another conversation and she is comfortable with the plan to go home.  She will return immediately if she develops additional bleeding.  She will also follow-up with her OB/GYN at [facility] at the next available opportunity after the weekend.    I consulted by phone with Nicole Berg with OB.  I specifically asked her whether the patient should continue on Lovenox given her known DVT, or whether she should stop it given the subchorionic hemorrhage.  Nicole Berg advised that it is better in these cases to continue the Lovenox given the potential risk of morbidity/mortality given a known DVT than the possible risk to the pregnancy.  I put this information in the patient's printed discharge instructions and she will follow-up at the next available opportunity and return if she develops worsening symptoms. [CF]  [phone] I verified that the patient has a positive blood and does not require RhoGAM [CF]    Clinical Course User Index [CF] Loleta Rose, MD     Final diagnoses:  Subchorionic hemorrhage of placenta in second trimester  Threatened miscarriage  HX: anticoagulation      Loleta Rose, MD 05/15/22 [id number]

## 2022-05-15 NOTE — Discharge Instructions (Addendum)
As we discussed, you have a reassuring ultrasound except for the presence of a small to moderate sized subchorionic hemorrhage which is likely the cause of the heavy bleeding you had earlier.  However since the bleeding stopped and you have remained stable, our OB/GYN service felt it would be appropriate for you to be discharged and follow-up with your OB provider at the next available opportunity.  Please continue on your medications.  At this point, the risk of not treating your known DVT is greater than the risk to your pregnancy, but it is important you understand that the presence of the subchorionic hemorrhage and the need for you to take the Lovenox does increase the risk of miscarriage.  Please do not gauge in any sexual activity or any vigorous activity or exercise and follow-up with your OB GYN on Monday.  Return immediately to the nearest emergency department if you develop new or worsening symptoms including new onset heavy bleeding.

## 2022-05-16 LAB — URINE CULTURE: Culture: NO GROWTH

## 2022-05-29 LAB — PANORAMA PRENATAL TEST FULL PANEL:PANORAMA TEST PLUS 5 ADDITIONAL MICRODELETIONS: FETAL FRACTION: 7.2

## 2022-06-24 ENCOUNTER — Other Ambulatory Visit: Payer: Self-pay

## 2022-06-24 ENCOUNTER — Observation Stay
Admission: EM | Admit: 2022-06-24 | Discharge: 2022-06-25 | Disposition: A | Discharge status: Home / Self Care | Payer: Medicaid Other | Attending: Certified Nurse Midwife | Admitting: Certified Nurse Midwife

## 2022-06-24 ENCOUNTER — Encounter: Payer: Self-pay | Admitting: Obstetrics and Gynecology

## 2022-06-24 DIAGNOSIS — D62 Acute posthemorrhagic anemia: Secondary | ICD-10-CM | POA: Diagnosis present

## 2022-06-24 DIAGNOSIS — Z833 Family history of diabetes mellitus: Secondary | ICD-10-CM | POA: Insufficient documentation

## 2022-06-24 DIAGNOSIS — O081 Delayed or excessive hemorrhage following ectopic and molar pregnancy: Secondary | ICD-10-CM | POA: Diagnosis present

## 2022-06-24 DIAGNOSIS — I471 Supraventricular tachycardia, unspecified: Secondary | ICD-10-CM | POA: Diagnosis not present

## 2022-06-24 DIAGNOSIS — Z86718 Personal history of other venous thrombosis and embolism: Secondary | ICD-10-CM | POA: Insufficient documentation

## 2022-06-24 DIAGNOSIS — Z880 Allergy status to penicillin: Secondary | ICD-10-CM

## 2022-06-24 DIAGNOSIS — O031 Delayed or excessive hemorrhage following incomplete spontaneous abortion: Principal | ICD-10-CM | POA: Insufficient documentation

## 2022-06-24 DIAGNOSIS — O021 Missed abortion: Secondary | ICD-10-CM | POA: Insufficient documentation

## 2022-06-24 DIAGNOSIS — O034 Incomplete spontaneous abortion without complication: Secondary | ICD-10-CM | POA: Diagnosis present

## 2022-06-24 DIAGNOSIS — Z87891 Personal history of nicotine dependence: Secondary | ICD-10-CM

## 2022-06-24 LAB — CBC WITH DIFFERENTIAL/PLATELET
Abs Immature Granulocytes: 0.03 10*3/uL (ref 0.00–0.07)
Basophils Absolute: 0 10*3/uL (ref 0.0–0.1)
Basophils Relative: 1 %
Eosinophils Absolute: 0.2 10*3/uL (ref 0.0–0.5)
Eosinophils Relative: 3 %
HCT: 31.7 % — ABNORMAL LOW (ref 36.0–46.0)
Hemoglobin: 10.2 g/dL — ABNORMAL LOW (ref 12.0–15.0)
Immature Granulocytes: 1 %
Lymphocytes Relative: 28 %
Lymphs Abs: 1.8 10*3/uL (ref 0.7–4.0)
MCH: 25.6 pg — ABNORMAL LOW (ref 26.0–34.0)
MCHC: 32.2 g/dL (ref 30.0–36.0)
MCV: 79.4 fL — ABNORMAL LOW (ref 80.0–100.0)
Monocytes Absolute: 0.5 10*3/uL (ref 0.1–1.0)
Monocytes Relative: 7 %
Neutro Abs: 3.9 10*3/uL (ref 1.7–7.7)
Neutrophils Relative %: 60 %
Platelets: 175 10*3/uL (ref 150–400)
RBC: 3.99 MIL/uL (ref 3.87–5.11)
RDW: 16 % — ABNORMAL HIGH (ref 11.5–15.5)
WBC: 6.5 10*3/uL (ref 4.0–10.5)
nRBC: 0 % (ref 0.0–0.2)

## 2022-06-24 LAB — TYPE AND SCREEN
ABO/RH(D): A POS
Unit division: 0

## 2022-06-24 LAB — CBC
HCT: 27.8 % — ABNORMAL LOW (ref 36.0–46.0)
Hemoglobin: 9.1 g/dL — ABNORMAL LOW (ref 12.0–15.0)
MCH: 25.5 pg — ABNORMAL LOW (ref 26.0–34.0)
MCHC: 32.7 g/dL (ref 30.0–36.0)
MCV: 77.9 fL — ABNORMAL LOW (ref 80.0–100.0)
Platelets: 146 10*3/uL — ABNORMAL LOW (ref 150–400)
RBC: 3.57 MIL/uL — ABNORMAL LOW (ref 3.87–5.11)
RDW: 16.1 % — ABNORMAL HIGH (ref 11.5–15.5)
WBC: 8.2 10*3/uL (ref 4.0–10.5)
nRBC: 0 % (ref 0.0–0.2)

## 2022-06-24 LAB — BASIC METABOLIC PANEL
Anion gap: 6 (ref 5–15)
BUN: 6 mg/dL (ref 6–20)
CO2: 21 mmol/L — ABNORMAL LOW (ref 22–32)
Calcium: 8.4 mg/dL — ABNORMAL LOW (ref 8.9–10.3)
Chloride: 107 mmol/L (ref 98–111)
Creatinine, Ser: 0.59 mg/dL (ref 0.44–1.00)
GFR, Estimated: >60 mL/min (ref >60)
Glucose, Bld: 92 mg/dL (ref 70–99)
Potassium: 3.7 mmol/L (ref 3.5–5.1)
Sodium: 134 mmol/L — ABNORMAL LOW (ref 135–145)

## 2022-06-24 LAB — BPAM RBC
Blood Product Expiration Date: 202406212359
Blood Product Expiration Date: 202406222359
Unit Type and Rh: 6200

## 2022-06-24 MED ORDER — METHYLERGONOVINE MALEATE 0.2 MG/ML IJ SOLN: 0.2000 mg | Freq: Four times a day (QID) | INTRAMUSCULAR | Status: DC

## 2022-06-24 MED ORDER — DIBUCAINE (PERIANAL) 1 % EX OINT: 1.0000 | TOPICAL_OINTMENT | CUTANEOUS | Status: DC | PRN

## 2022-06-24 MED ORDER — OXYCODONE HCL 5 MG PO TABS: 5.0000 mg | ORAL_TABLET | ORAL | Status: DC | PRN

## 2022-06-24 MED ORDER — METHYLERGONOVINE MALEATE 0.2 MG/ML IJ SOLN
INTRAMUSCULAR | Status: AC
  Administered 2022-06-24: 0.2 mg via INTRAMUSCULAR
  Filled 2022-06-24: qty 1

## 2022-06-24 MED ORDER — ZOLPIDEM TARTRATE 5 MG PO TABS: 5.0000 mg | ORAL_TABLET | Freq: Every evening | ORAL | Status: DC | PRN

## 2022-06-24 MED ORDER — OXYTOCIN BOLUS FROM INFUSION
333.0000 mL | Freq: Once | INTRAVENOUS | Status: AC
  Administered 2022-06-24: 333 mL via INTRAVENOUS
  Filled 2022-06-24: qty 500

## 2022-06-24 MED ORDER — OXYCODONE HCL 5 MG PO TABS: 10.0000 mg | ORAL_TABLET | ORAL | Status: DC | PRN

## 2022-06-24 MED ORDER — ACETAMINOPHEN 325 MG PO TABS
650.0000 mg | ORAL_TABLET | ORAL | Status: DC | PRN
  Administered 2022-06-24 – 2022-06-25 (×2): 650 mg via ORAL
  Filled 2022-06-24: qty 2

## 2022-06-24 MED ORDER — TRANEXAMIC ACID-NACL 1000-0.7 MG/100ML-% IV SOLN
INTRAVENOUS | Status: AC
  Filled 2022-06-24: qty 100

## 2022-06-24 MED ORDER — OXYCODONE-ACETAMINOPHEN 5-325 MG PO TABS: 2.0000 | ORAL_TABLET | ORAL | Status: DC | PRN

## 2022-06-24 MED ORDER — OXYCODONE-ACETAMINOPHEN 5-325 MG PO TABS: 1.0000 | ORAL_TABLET | ORAL | Status: DC | PRN

## 2022-06-24 MED ORDER — MISOPROSTOL 200 MCG PO TABS
ORAL_TABLET | ORAL | Status: AC
  Filled 2022-06-24: qty 1

## 2022-06-24 MED ORDER — MISOPROSTOL 200 MCG PO TABS
ORAL_TABLET | ORAL | Status: AC
  Administered 2022-06-24: 1000 ug via RECTAL
  Filled 2022-06-24: qty 4

## 2022-06-24 MED ORDER — FENTANYL CITRATE (PF) 100 MCG/2ML IJ SOLN
INTRAMUSCULAR | Status: AC
  Administered 2022-06-24: 100 ug via INTRAVENOUS
  Filled 2022-06-24: qty 2

## 2022-06-24 MED ORDER — SODIUM CHLORIDE 0.9 % IV SOLN
300.0000 mg | Freq: Once | INTRAVENOUS | Status: AC
  Administered 2022-06-24: 300 mg via INTRAVENOUS
  Filled 2022-06-24: qty 300

## 2022-06-24 MED ORDER — WITCH HAZEL-GLYCERIN EX PADS: 1.0000 | MEDICATED_PAD | CUTANEOUS | Status: DC | PRN

## 2022-06-24 MED ORDER — MISOPROSTOL 200 MCG PO TABS: 1000.0000 ug | ORAL_TABLET | Freq: Once | ORAL | Status: AC

## 2022-06-24 MED ORDER — ONDANSETRON HCL 4 MG/2ML IJ SOLN
4.0000 mg | Freq: Four times a day (QID) | INTRAMUSCULAR | Status: DC | PRN
  Filled 2022-06-24: qty 2

## 2022-06-24 MED ORDER — PRENATAL MULTIVITAMIN CH: 1.0000 | ORAL_TABLET | Freq: Every day | ORAL | Status: DC

## 2022-06-24 MED ORDER — METHYLERGONOVINE MALEATE 0.2 MG PO TABS
0.2000 mg | ORAL_TABLET | Freq: Four times a day (QID) | ORAL | Status: DC
  Administered 2022-06-24 – 2022-06-25 (×2): 0.2 mg via ORAL
  Filled 2022-06-24 (×2): qty 1

## 2022-06-24 MED ORDER — LIDOCAINE HCL (PF) 1 % IJ SOLN: 30.0000 mL | INTRAMUSCULAR | Status: DC | PRN

## 2022-06-24 MED ORDER — OXYTOCIN-SODIUM CHLORIDE 30-0.9 UT/500ML-% IV SOLN
2.5000 [IU]/h | INTRAVENOUS | Status: DC
  Administered 2022-06-24: 2.5 [IU]/h via INTRAVENOUS
  Filled 2022-06-24 (×2): qty 500

## 2022-06-24 MED ORDER — LACTATED RINGERS IV SOLN: INTRAVENOUS | Status: DC

## 2022-06-24 MED ORDER — IBUPROFEN 600 MG PO TABS
600.0000 mg | ORAL_TABLET | Freq: Four times a day (QID) | ORAL | Status: DC
  Administered 2022-06-25: 600 mg via ORAL
  Filled 2022-06-24 (×2): qty 1

## 2022-06-24 MED ORDER — SIMETHICONE 80 MG PO CHEW: 80.0000 mg | CHEWABLE_TABLET | ORAL | Status: DC | PRN

## 2022-06-24 MED ORDER — COCONUT OIL OIL: 1.0000 | TOPICAL_OIL | Status: DC | PRN

## 2022-06-24 MED ORDER — FENTANYL CITRATE (PF) 100 MCG/2ML IJ SOLN: 50.0000 ug | INTRAMUSCULAR | Status: DC | PRN

## 2022-06-24 MED ORDER — BENZOCAINE-MENTHOL 20-0.5 % EX AERO: 1.0000 | INHALATION_SPRAY | CUTANEOUS | Status: DC | PRN

## 2022-06-24 MED ORDER — CEFAZOLIN SODIUM-DEXTROSE 2-4 GM/100ML-% IV SOLN
INTRAVENOUS | Status: AC
  Administered 2022-06-24: 2 g via INTRAVENOUS
  Filled 2022-06-24: qty 100

## 2022-06-24 MED ORDER — LACTATED RINGERS IV SOLN
500.0000 mL | INTRAVENOUS | Status: DC | PRN
  Administered 2022-06-24 (×2): 500 mL via INTRAVENOUS

## 2022-06-24 MED ORDER — SOD CITRATE-CITRIC ACID 500-334 MG/5ML PO SOLN: 30.0000 mL | ORAL | Status: DC | PRN

## 2022-06-24 MED ORDER — DIPHENHYDRAMINE HCL 25 MG PO CAPS: 25.0000 mg | ORAL_CAPSULE | Freq: Four times a day (QID) | ORAL | Status: DC | PRN

## 2022-06-24 MED ORDER — SODIUM CHLORIDE 0.9% IV SOLUTION: Freq: Once | INTRAVENOUS | Status: DC

## 2022-06-24 MED ORDER — FENTANYL CITRATE PF 50 MCG/ML IJ SOSY: 50.0000 ug | PREFILLED_SYRINGE | INTRAMUSCULAR | Status: DC | PRN

## 2022-06-24 MED ORDER — METHYLERGONOVINE MALEATE 0.2 MG/ML IJ SOLN: 0.2000 mg | Freq: Once | INTRAMUSCULAR | Status: AC

## 2022-06-24 MED ORDER — ONDANSETRON HCL 4 MG/2ML IJ SOLN: 4.0000 mg | INTRAMUSCULAR | Status: DC | PRN

## 2022-06-24 MED ORDER — SENNOSIDES-DOCUSATE SODIUM 8.6-50 MG PO TABS: 2.0000 | ORAL_TABLET | Freq: Every day | ORAL | Status: DC

## 2022-06-24 MED ORDER — ACETAMINOPHEN 325 MG PO TABS
650.0000 mg | ORAL_TABLET | ORAL | Status: DC | PRN
  Filled 2022-06-24: qty 2

## 2022-06-24 MED ORDER — ONDANSETRON HCL 4 MG PO TABS: 4.0000 mg | ORAL_TABLET | ORAL | Status: DC | PRN

## 2022-06-24 MED ORDER — CEFAZOLIN SODIUM-DEXTROSE 2-4 GM/100ML-% IV SOLN: 2.0000 g | Freq: Once | INTRAVENOUS | Status: AC

## 2022-06-24 NOTE — ED Triage Notes (Signed)
Pt to ED via [facility] from home. For c/o miscarriage at 16 weeks. Pt delivered fetus at home, placenta not delivered yet. Pt is G4 P3. Due date 12/10/22. Denies pain.

## 2022-06-24 NOTE — ED Provider Notes (Signed)
[facility] Provider Note    Event Date/Time   First MD Initiated Contact with Patient 06/24/22 0930     (approximate)   History   Miscarriage   HPI  Nicole Berg is a 37 y.o. female  [phone] presents to the ER for evaluation of incomplete miscarriage.  She is 16 weeks followed at high risk OB at [facility].  States this morning she was feeding the 6-month-old at home felt a little cramp-like she needed to use the bathroom went to the commode and had passage of fetus.  Is not having any significant bleeding.  Denies any pain right now.        Physical Exam   Triage Vital Signs: ED Triage Vitals  Enc Vitals Group     BP      Pulse      Resp      Temp      Temp src      SpO2      Weight      Height      Head Circumference      Peak Flow      Pain Score      Pain Loc      Pain Edu?      Excl. in GC?     Most recent vital signs: Vitals:   06/24/22 0956  BP: 101/64  Pulse: 87  Resp: 18  Temp: 98 F (36.7 C)  SpO2: 99%     Constitutional: Alert  Eyes: Conjunctivae are normal.  Head: Atraumatic. Nose: No congestion/rhinnorhea. Mouth/Throat: Mucous membranes are moist.   Neck: Painless ROM.  Cardiovascular:   Good peripheral circulation. Respiratory: Normal respiratory effort.  No retractions.  Gastrointestinal: Soft and nontender.  16-week approximate gestation fetus held in mother's hand.  No bleeding.  Placenta not yet passed. Musculoskeletal:  no deformity Neurologic:  MAE spontaneously. No gross focal neurologic deficits are appreciated.  Skin:  Skin is warm, dry and intact. No rash noted. Psychiatric: Mood and affect are normal. Speech and behavior are normal.    ED Results / Procedures / Treatments   Labs (all labs ordered are listed, but only abnormal results are displayed) Labs Reviewed  CBC WITH DIFFERENTIAL/PLATELET - Abnormal; Notable for the following components:      Result Value   Hemoglobin 10.2 (*)    HCT 31.7  (*)    MCV 79.4 (*)    MCH 25.6 (*)    RDW 16.0 (*)    All other components within normal limits  BASIC METABOLIC PANEL - Abnormal; Notable for the following components:   Sodium 134 (*)    CO2 21 (*)    Calcium 8.4 (*)    All other components within normal limits  TYPE AND SCREEN     EKG     RADIOLOGY    PROCEDURES:  Critical Care performed: No  Procedures   MEDICATIONS ORDERED IN ED: Medications - No data to display   IMPRESSION / MDM / ASSESSMENT AND PLAN / ED COURSE  I reviewed the triage vital signs and the nursing notes.                              Differential diagnosis includes, but is not limited to, threatened AB, complete miscarriage, incomplete miscarriage, abruption, ectopic  Patient presenting to the ER for evaluation of symptoms as described above.  Based on symptoms, risk factors and considered above  differential, this presenting complaint could reflect a potentially life-threatening illness therefore the patient will be placed on continuous pulse oximetry and telemetry for monitoring.  Laboratory evaluation will be sent to evaluate for the above complaints.  Presentation currently consistent with an incomplete miscarriage.  Will consult OB.   Clinical Course as of 06/24/22 1029  Fri Jun 24, 2022  1005 Discussed case in consultation with Dr. Jean Rosenthal.  He is currently in the OR.  Backup CNM will come evaluate patient at bedside.  She remains hemodynamically stable. [PR]    Clinical Course User Index [PR] Willy Eddy, MD   Patient evaluated at bedside.  Plan admit to OB.  FINAL CLINICAL IMPRESSION(S) / ED DIAGNOSES   Final diagnoses:  Incomplete miscarriage     Rx / DC Orders   ED Discharge Orders     None        Note:  This document was prepared using Dragon voice recognition software and may include unintentional dictation errors.    Willy Eddy, MD 06/24/22 1029

## 2022-06-24 NOTE — ED Notes (Signed)
Report called to [facility], RN, with labor and delivery

## 2022-06-24 NOTE — Progress Notes (Signed)
Speculum exam performed to visualize umbilical cord coming from cervical os. Unable to visualize anything coming from the cervical os. Palpated and able to feel the end of the placenta at the cervical os. Administered IV Fentanyl and attempted manual sweep for placenta but placenta still adhered to uterine wall. Unable to remove any part of the placenta. Her bleeding is scant, barely any blood noted with manual sweep. Discussed with Dr. Jean Rosenthal and as long as her bleeding remains stable, will wait several hours for placenta. Patient to remain NPO. No need for uterotonics d/t risk of placental entrapment.   Janyce Llanos, CNM 06/24/2022 12:08 PM

## 2022-06-24 NOTE — H&P (Signed)
OB History & Physical   History of Present Illness:  Chief Complaint:   HPI:  Nicole Berg is a 37 y.o. G40P2002 female at [redacted]w[redacted]d dated by LMP c/w 12wk [place].  She presents to the ER for miscarrying at home. She states this morning she was feeding her toddler and noticed some LLQ "gas"-like pains. She went to the bathroom to get ready for the day and delivered on the toilet around 0845. The baby was inside the sac and she did not notice any movement from the baby. She has not delivered the placenta yet. Bleeding is scant. She denies any cramps or pain at this time. She is sitting on the ER stretcher holding the baby in her hands.   She has not eaten or drank since last night. She last administered her Lovenox yesterday.   Pregnancy Issues: 1. SVT 2. Blood clot March 2024, taking Lovenox 3. History of pre-eclampsia 4. 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 Authorizing Provider  acetaminophen (TYLENOL) 325 MG tablet Take 650 mg by mouth every 6 (six) hours as needed for moderate pain.    [provider]  aspirin EC 81 MG tablet Take 1 tablet (81 mg total) by mouth daily. Take after 12 weeks for prevention of preeclampsia later in pregnancy Patient not taking: Reported on 05/15/2022 05/11/21   Anyanwu, Jethro Bastos, MD  cephALEXin (KEFLEX) 500 MG capsule Take 1 capsule (500 mg total) by mouth 3 (three) times daily. Patient not taking: Reported on 12/02/2021 11/15/21   Federico Flake, MD  enoxaparin (LOVENOX) 100 MG/ML injection Inject 100 mg into the skin every 12 (twelve) hours.    [provider]  ferrous gluconate (FERGON) 324 MG tablet Take 1 tablet (324 mg total) by mouth daily with breakfast. Patient not taking: Reported on 09/14/2021 08/11/21   Anyanwu, Jethro Bastos, MD  Ferrous Sulfate (IRON PO) Take 1 tablet by mouth every other day.    [provider]  norethindrone (MICRONOR) 0.35 MG tablet Take 1 tablet (0.35 mg total) by mouth daily. Patient not taking: Reported on 05/15/2022 12/02/21 11/27/22  Calvert Cantor, CNM  ondansetron (ZOFRAN-ODT) 4 MG disintegrating tablet Take 1 tablet (4 mg total) by mouth every 6 (six) hours as needed for nausea. Patient not taking: Reported on 07/06/2021 05/11/21   Anyanwu, Jethro Bastos, MD  Prenatal MV & Min w/FA-DHA (PRENATAL ADULT GUMMY/DHA/FA PO) Take by mouth.    [provider]  promethazine (PHENERGAN) 25 MG tablet Take 1 tablet (25 mg total) by mouth every 6 (six) hours as needed for nausea or vomiting. Patient not taking: Reported on 06/15/2021 05/11/21   Anyanwu, Jethro Bastos, MD  senna-docusate (SENOKOT-S) 8.6-50 MG tablet Take 2 tablets by mouth 2 (two) times daily as needed for mild constipation or moderate constipation. 05/11/21   Tereso Newcomer, MD    Prenatal care site: [facility], unassigned  Social  History: She  reports that she has quit smoking. She has never used smokeless tobacco. She reports that she does not currently use alcohol. She reports that she does not use drugs.  Family History: family history includes Diabetes in her mother and paternal grandfather; Hypertension in her father; Lung cancer in her mother.   Review of Systems: A full review of systems was performed and negative except as noted in the HPI.     Physical Exam:  Vital Signs: BP 101/64 (BP Location: Left Arm)   Pulse 87   Temp 98 F (36.7 C) (Oral)   Resp 18   Ht 5\' 6"  (1.676 m)   Wt 103.4 kg   SpO2 99%   BMI 36.80  kg/m  General: no acute distress.  HEENT: normocephalic, atraumatic Heart: regular rate & rhythm.  No murmurs/rubs/gallops Lungs: clear to auscultation bilaterally, normal respiratory effort Abdomen: soft, gravid, non-tender;   Pelvic:   External: Normal external female genitalia, umbilical cord not visualized at vaginal introitus   Extremities: non-tender, symmetric, no edema bilaterally.  DTRs: +2  Neurologic: Alert & oriented x 3.    Results for orders placed or performed during the hospital encounter of 06/24/22 (from the past 24 hour(s))  Type and screen [facility]     Status: None   Collection Time: 06/24/22  9:39 AM  Result Value Ref Range   ABO/RH(D) A POS    Antibody Screen NEG    Sample Expiration      06/27/2022,2359 Performed at [facility], [address] [place], [place] [address]   CBC with Differential     Status: Abnormal   Collection Time: 06/24/22  9:39 AM  Result Value Ref Range   WBC 6.5 4.0 - 10.5 K/uL   RBC 3.99 3.87 - 5.11 MIL/uL   Hemoglobin 10.2 (L) 12.0 - 15.0 g/dL   HCT 91.4 (L) 78.2 - 95.6 %   MCV 79.4 (L) 80.0 - 100.0 fL   MCH 25.6 (L) 26.0 - 34.0 pg   MCHC 32.2 30.0 - 36.0 g/dL   RDW 21.3 (H) 08.6 - 57.8 %   Platelets 175 150 - 400 K/uL   nRBC 0.0 0.0 - 0.2 %   Neutrophils Relative % 60 %   Neutro Abs 3.9 1.7 - 7.7 K/uL   Lymphocytes Relative 28 %   Lymphs Abs 1.8 0.7 - 4.0 K/uL   Monocytes Relative 7 %   Monocytes Absolute 0.5 0.1 - 1.0 K/uL   Eosinophils Relative 3 %   Eosinophils Absolute 0.2 0.0 - 0.5 K/uL   Basophils Relative 1 %   Basophils Absolute 0.0 0.0 - 0.1 K/uL   Immature Granulocytes 1 %   Abs Immature Granulocytes 0.03 0.00 - 0.07 K/uL  Basic metabolic panel     Status: Abnormal   Collection Time: 06/24/22  9:39 AM  Result Value Ref Range   Sodium 134 (L) 135 - 145 mmol/L   Potassium 3.7 3.5 - 5.1 mmol/L   Chloride 107 98 - 111 mmol/L   CO2 21 (L) 22 - 32 mmol/L   Glucose, Bld 92 70  - 99 mg/dL   BUN 6 6 - 20 mg/dL   Creatinine, Ser 4.69 0.44 - 1.00 mg/dL   Calcium 8.4 (L) 8.9 - 10.3 mg/dL   GFR, Estimated >62 >95 mL/min   Anion gap 6 5 - 15    Pertinent Results:  Prenatal Labs: Blood type/Rh A pos  Antibody screen neg  Rubella Immune  Varicella Immune  RPR NR  HBsAg Neg  HIV NR  GC neg  Chlamydia neg  Genetic screening negative  1 hour GTT Not performed  3 hour GTT   GBS N/a   No results found.  Assessment:  Nicole Berg is a 37 y.o. G13P2002 female at [redacted]w[redacted]d with IUFD and miscarriage at home.   Plan:  1. Admit to Labor & Delivery; consents reviewed and obtained - Dr. Jean Rosenthal notified of admission  2. Routine OB: - Prenatal labs reviewed, as above - Rh positive - CBC, T&S, on admit - Clear fluids, IVF  4. IUFD - Delivered at home prior to arrival - Placenta retained, bleeding scant. Will evaluate upon arrival to L&D for easy removal. If retained and unable to easily remove and bleeding scant, will wait for delivery of placenta unless indicated for D&C. - Stay NPO at this time in case of D&C  5. Post Partum Planning: - Contraception: TBD  Janyce Llanos, CNM 06/24/22 10:46 AM

## 2022-06-24 NOTE — Progress Notes (Signed)
Jackson MD at bedside to attempt manual removal of retained placenta. Pt offered IV pain medication and declines at this time. Bps 90s/50s and 80s/50s. IV fluid bolus initiated per MD verbal order.

## 2022-06-24 NOTE — Progress Notes (Signed)
Patient still had not delivered placenta. She delivered the baby at 530AM today. Her bleeding has been scant.   Discussed need to remove placenta. We could try medication, perhaps wait a little longer, or see if I am able to grab the placenta using a ring forceps.  Mutual decision to use ring forceps.  I was gradually able to tease the placenta out and eventually it passed. This was done with a bedside ultrasound to assist.  There still appeared to be tissue at the os on palpation.  I tried to grab this tissue with the ring forceps. However, this tissue simply gave way and appeared to be a clot.  I tried to remove as much clot as reasonably possible. She did have echogenic material at the fundus and this was grasped with the ring forceps and extracted.  DIscussed termination of procedure and to give uterotonics to expel any remaining clots, bits of POC.  Her lochia is normal.  She agreed to this. She was given pitocin, methergine, and misoprostol (PR) 1,000 mcg.  Her QBL was about 650 mL as measured in the under-buttocks drape.   Will follow bleeding closely. If worsens, will need to go to the OR for removal.  She voiced understanding of this and agreement.   RN and CNM present at bedside during procedure.  Thomasene Mohair, MD, [facility] 06/24/2022 6:57 PM

## 2022-06-24 NOTE — Progress Notes (Addendum)
RN at bedside for BP 86/60. Fundus firm, U/2, bleeding scant. Patient denies dizziness and states she feels "fine". Informed Donato Schultz, CNM (in dept). Instructed to lay patient back. Repeat BP 102/50. Placed IV team consult for second line d/t many unsuccessful attempts of IV placement today.

## 2022-06-25 ENCOUNTER — Encounter: Payer: Self-pay | Admitting: Obstetrics and Gynecology

## 2022-06-25 DIAGNOSIS — D62 Acute posthemorrhagic anemia: Secondary | ICD-10-CM | POA: Insufficient documentation

## 2022-06-25 LAB — TYPE AND SCREEN
Antibody Screen: NEGATIVE
Unit division: 0

## 2022-06-25 LAB — BPAM RBC: Unit Type and Rh: 6200

## 2022-06-25 LAB — CBC
HCT: 27.8 % — ABNORMAL LOW (ref 36.0–46.0)
Hemoglobin: 8.8 g/dL — ABNORMAL LOW (ref 12.0–15.0)
MCH: 25.2 pg — ABNORMAL LOW (ref 26.0–34.0)
MCHC: 31.7 g/dL (ref 30.0–36.0)
MCV: 79.7 fL — ABNORMAL LOW (ref 80.0–100.0)
Platelets: 158 10*3/uL (ref 150–400)
RBC: 3.49 MIL/uL — ABNORMAL LOW (ref 3.87–5.11)
RDW: 16.2 % — ABNORMAL HIGH (ref 11.5–15.5)
WBC: 8.3 10*3/uL (ref 4.0–10.5)
nRBC: 0 % (ref 0.0–0.2)

## 2022-06-25 LAB — PREPARE RBC (CROSSMATCH)

## 2022-06-25 MED ORDER — ENOXAPARIN SODIUM 100 MG/ML IJ SOSY
100.0000 mg | PREFILLED_SYRINGE | Freq: Two times a day (BID) | INTRAMUSCULAR | Status: DC
  Administered 2022-06-25: 100 mg via SUBCUTANEOUS
  Filled 2022-06-25: qty 1

## 2022-06-25 MED ORDER — LORATADINE 10 MG PO TABS
10.0000 mg | ORAL_TABLET | Freq: Every day | ORAL | Status: DC
  Administered 2022-06-25: 10 mg via ORAL
  Filled 2022-06-25: qty 1

## 2022-06-25 MED ORDER — ENOXAPARIN SODIUM 100 MG/ML IJ SOSY: 100.0000 mg | PREFILLED_SYRINGE | Freq: Two times a day (BID) | INTRAMUSCULAR | 0 refills | Status: AC

## 2022-06-25 NOTE — Discharge Summary (Signed)
Obstetrical Discharge Summary  Patient Name: Nicole Berg DOB: 03-Dec-1985 MRN: [id number]  Date of Admission: 06/24/2022 Date of Delivery: 06/24/22 Delivered by: patient at home, placenta delivered by Thomasene Mohair, MD Date of Discharge: 06/25/2022  Primary OB: [facility] LMP:No LMP recorded. EDC Estimated Date of Delivery: 12/10/22 Gestational Age at Delivery: [redacted]w[redacted]d   Antepartum complications:  - SVT - hx of blood clot 03/24 - closely spaced pregnancies - history of pre-eclampsia - advanced maternal age  Admitting Diagnosis:  IUFD Secondary Diagnosis: retained placenta, postpartum hemorrhage Patient Active Problem List   Diagnosis Date Noted   Acute blood loss anemia 06/25/2022   IUFD at less than 20 weeks of gestation 06/24/2022   Positive GBS test 10/26/2021   Penicillin allergy 10/07/2021   Anemia of pregnancy, second trimester 08/11/2021   Chlamydia infection affecting pregnancy in first trimester 05/17/2021   Supervision of high-risk pregnancy 05/11/2021   Advanced maternal age in multigravida 05/11/2021   History of pre-eclampsia in prior pregnancy, currently pregnant 05/11/2021   Obesity in pregnancy, antepartum 05/11/2021   Migraines 05/11/2021   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 Placenta was retained and was manually removed. She experienced a postpartum hemorrhage with the placenta removal and received Venofer. MFM was consulted about Lovenox, Dr. Judeth Cornfield recommended to continue it at 100mg  BID Date of Delivery: 06/24/22 Delivered By: self, Thomasene Mohair, MD Delivery Type: spontaneous vaginal delivery Anesthesia: epidural Placenta: spontaneous Laceration: none Episiotomy: none Newborn Data: Non-viable female  Birth  Weight: 4 oz (113 g) APGAR: 0, 0  Newborn Delivery   Birth date/time: 06/24/2022 08:45:00 Delivery type: Vaginal, Spontaneous      Postpartum Procedures: IV Venofer Edinburgh:     12/02/2021   10:20 AM  Inocente Salles Postnatal Depression Scale Screening Tool  I have been able to laugh and see the funny side of things. 0  I have looked forward with enjoyment to things. 0  I have blamed myself unnecessarily when things went wrong. 0  I have been anxious or worried for no good reason. 0  I have felt scared or panicky for no good reason. 0  Things have been getting on top of me. 0  I have been so unhappy that I have had difficulty sleeping. 0  I have felt sad or miserable. 0  I have been so unhappy that I have been crying. 0  The thought of harming myself has occurred to me. 0  Edinburgh Postnatal Depression Scale Total 0      Post partum course:   Patient had a postpartum course complicated by retained placenta and postpartum hemorrhage. She had the placenta manually removed and received IV Venofer and antibiotics.  By time of discharge on PPD#1, her pain was controlled on oral pain medications; she had appropriate lochia and was ambulating, voiding without difficulty and tolerating regular diet.  She was deemed stable for discharge to home.    Discharge Physical Exam:   BP 105/64 (BP Location: Left Arm)   Pulse 85   Temp 97.9 F (36.6 C) (Oral)   Resp 16   Ht 5\' 6"  (1.676 m)   Wt 103.4 kg   SpO2 100%   BMI 36.80 kg/m   General: NAD CV: RRR Pulm: nl effort ABD: s/nd/nt, fundus firm and below the umbilicus Lochia: moderate Perineum:  intact DVT Evaluation: LE non-ttp, no evidence of DVT on exam.  Hemoglobin  Date Value Ref Range Status  06/25/2022 8.8 (L) 12.0 - 15.0 g/dL Final  96/05/5407 81.1 11.1 - 15.9 g/dL Final   HCT  Date Value Ref Range Status  06/25/2022 27.8 (L) 36.0 - 46.0 % Final   Hematocrit  Date Value Ref Range Status  10/07/2021 34.9 34.0 - 46.6 %  Final    Disposition: stable, discharge to home. Baby Disposition: creamation  Rh Immune globulin given: A positive Rubella vaccine given: immune Varicella vaccine given: immune Tdap vaccine given in AP or PP setting: n/a Flu vaccine given in AP or PP setting: n/a  Contraception: TBD  Prenatal Labs:  Blood type/Rh A pos  Antibody screen neg  Rubella Immune  Varicella Immune  RPR NR  HBsAg Neg  HIV NR  GC neg  Chlamydia neg  Genetic screening negative  1 hour GTT Not performed  3 hour GTT    GBS N/a     Plan:  Nicole Berg was discharged to home in good condition.  Discharge Medications: Allergies as of 06/25/2022       Reactions   Amoxicillin Hives   Prunus Persica    Other Reaction(s): SWELLING/EDEMA   Nitrofurantoin Itching   Penicillins Hives, Itching        Medication List     STOP taking these medications    aspirin EC 81 MG tablet   cephALEXin 500 MG capsule Commonly known as: KEFLEX   ferrous gluconate 324 MG tablet Commonly known as: FERGON   norethindrone 0.35 MG tablet Commonly known as: MICRONOR   ondansetron 4 MG disintegrating tablet Commonly known as: ZOFRAN-ODT   promethazine 25 MG tablet Commonly known as: PHENERGAN       TAKE these medications    acetaminophen 325 MG tablet Commonly known as: TYLENOL Take 650 mg by mouth every 6 (six) hours as needed for moderate pain.   cetirizine 5 MG tablet Commonly known as: ZYRTEC Take 5 mg by mouth daily.   enoxaparin 100 MG/ML injection Commonly known as: LOVENOX Inject 1 mL (100 mg total) into the skin every 12 (twelve) hours.   IRON PO Take 1 tablet by mouth every other day.   PRENATAL ADULT GUMMY/DHA/FA PO Take by mouth.   senna-docusate 8.6-50 MG tablet Commonly known as: Senokot-S Take 2 tablets by mouth 2 (two) times daily as needed for mild constipation or moderate constipation.         Follow-up Information     Conard Novak, MD Follow up in 6  week(s).   Specialty: Obstetrics and Gynecology Why: 6wk postpartum Contact information: [address] [place] [address] [phone]         Janyce Llanos, CNM Follow up in 2 week(s).   Specialty: Certified Nurse Midwife Why: 2wk mood check Contact information: [address] [place] [address] [phone]                 Signed:  Gregery Na 06/25/2022 12:43 PM

## 2022-06-25 NOTE — Progress Notes (Signed)
Assisted Nicole Berg to bathroom and completed morning hygiene tasks. She walked around the room and sat in the recliner for over an hour. Nicole Berg then walked 3 laps around the unit with RN before returning to bed. SCDs applied. All activity tolerated well. Friend/coworker Nicole Berg at bedside for support.

## 2022-06-25 NOTE — Consult Note (Signed)
ANTICOAGULATION CONSULT NOTE   Pharmacy Consult for Enoxaparin Indication: pulmonary embolus  Allergies  Allergen Reactions   Amoxicillin Hives   Prunus Persica     Other Reaction(s): SWELLING/EDEMA   Nitrofurantoin Itching   Penicillins Hives and Itching    Patient Measurements: Height: 5\' 6"  (167.6 cm) Weight: 103.4 kg (228 lb) IBW/kg (Calculated) : 59.3  Vital Signs: Temp: 97.2 F (36.2 C) (05/25 0550) Temp Source: Oral (05/25 0550) BP: 110/74 (05/25 0550) Pulse Rate: 82 (05/25 0550)  Labs: Recent Labs    06/24/22 0939 06/24/22 2049 06/25/22 0554  HGB 10.2* 9.1* 8.8*  HCT 31.7* 27.8* 27.8*  PLT 175 146* 158  CREATININE 0.59  --   --     Estimated Creatinine Clearance: 115.8 mL/min (by C-G formula based on SCr of 0.59 mg/dL).   Medical History: Past Medical History:  Diagnosis Date   Chlamydia 05/17/2021   DVT (deep venous thrombosis) (HCC) 2000   Right  leg, due to OCPs   Migraines     Medications:  Medications Prior to Admission  Medication Sig Dispense Refill Last Dose   acetaminophen (TYLENOL) 325 MG tablet Take 650 mg by mouth every 6 (six) hours as needed for moderate pain.   Past Week   cetirizine (ZYRTEC) 5 MG tablet Take 5 mg by mouth daily.   06/23/2022   enoxaparin (LOVENOX) 100 MG/ML injection Inject 100 mg into the skin every 12 (twelve) hours.   Past Week   Ferrous Sulfate (IRON PO) Take 1 tablet by mouth every other day.   Past Week   Prenatal MV & Min w/FA-DHA (PRENATAL ADULT GUMMY/DHA/FA PO) Take by mouth.   06/23/2022   senna-docusate (SENOKOT-S) 8.6-50 MG tablet Take 2 tablets by mouth 2 (two) times daily as needed for mild constipation or moderate constipation. 30 tablet 2 06/23/2022   aspirin EC 81 MG tablet Take 1 tablet (81 mg total) by mouth daily. Take after 12 weeks for prevention of preeclampsia later in pregnancy (Patient not taking: Reported on 05/15/2022) 300 tablet 2    cephALEXin (KEFLEX) 500 MG capsule Take 1 capsule (500 mg  total) by mouth 3 (three) times daily. (Patient not taking: Reported on 12/02/2021) 21 capsule 0    ferrous gluconate (FERGON) 324 MG tablet Take 1 tablet (324 mg total) by mouth daily with breakfast. (Patient not taking: Reported on 09/14/2021) 30 tablet 3    norethindrone (MICRONOR) 0.35 MG tablet Take 1 tablet (0.35 mg total) by mouth daily. (Patient not taking: Reported on 05/15/2022) 30 tablet 11    ondansetron (ZOFRAN-ODT) 4 MG disintegrating tablet Take 1 tablet (4 mg total) by mouth every 6 (six) hours as needed for nausea. (Patient not taking: Reported on 07/06/2021) 20 tablet 0    promethazine (PHENERGAN) 25 MG tablet Take 1 tablet (25 mg total) by mouth every 6 (six) hours as needed for nausea or vomiting. (Patient not taking: Reported on 06/15/2021) 30 tablet 2    Scheduled:   enoxaparin (LOVENOX) injection  100 mg Subcutaneous Q12H   ibuprofen  600 mg Oral Q6H   methylergonovine  0.2 mg Oral QID   Or   methylergonovine  0.2 mg Intramuscular QID   prenatal multivitamin  1 tablet Oral Q1200   senna-docusate  2 tablet Oral Daily   Infusions:   oxytocin Stopped (06/24/22 2254)   PRN: acetaminophen, benzocaine-Menthol, coconut oil, witch hazel-glycerin **AND** dibucaine, diphenhydrAMINE, ondansetron **OR** ondansetron (ZOFRAN) IV, oxyCODONE, oxyCODONE, simethicone, zolpidem Anti-infectives (From admission, onward)    Start  Dose/Rate Route Frequency Ordered Stop   06/24/22 2015  ceFAZolin (ANCEF) IVPB 2g/100 mL premix        2 g 200 mL/hr over 30 Minutes Intravenous  Once 06/24/22 1926 06/24/22 2002       Assessment: Pharmacy consulted to start enoxaparin for hx of VTE. Hgb stable and discussed with CNM and plan to restart enoxaparin today.   Goal of Therapy:  Anti-Xa level 0.6-1 units/ml 4hrs after LMWH dose given Monitor platelets by anticoagulation protocol: Yes   Plan:  Lovenox 100 mg BID. Plan to order enoxaparin level after the 3rd dose.   Ronnald Ramp, PharmD,  BCPS 06/25/2022,8:32 AM

## 2022-06-27 NOTE — Progress Notes (Signed)
06/27/22 1030am: Fetal remains released to transport(Dart)service for [facility].

## 2022-07-05 LAB — SURGICAL PATHOLOGY

## 2023-05-31 ENCOUNTER — Encounter: Payer: Self-pay | Admitting: Medical Oncology

## 2023-05-31 ENCOUNTER — Emergency Department: Admission: EM | Admit: 2023-05-31 | Discharge: 2023-05-31 | Disposition: A | Discharge status: Home / Self Care

## 2023-05-31 DIAGNOSIS — Z23 Encounter for immunization: Secondary | ICD-10-CM | POA: Insufficient documentation

## 2023-05-31 DIAGNOSIS — S60452A Superficial foreign body of right middle finger, initial encounter: Secondary | ICD-10-CM | POA: Insufficient documentation

## 2023-05-31 DIAGNOSIS — W458XXA Other foreign body or object entering through skin, initial encounter: Secondary | ICD-10-CM | POA: Insufficient documentation

## 2023-05-31 DIAGNOSIS — Z189 Retained foreign body fragments, unspecified material: Secondary | ICD-10-CM

## 2023-05-31 MED ORDER — LIDOCAINE HCL (PF) 1 % IJ SOLN
5.0000 mL | Freq: Once | INTRAMUSCULAR | Status: AC
  Administered 2023-05-31: 5 mL via INTRADERMAL
  Filled 2023-05-31: qty 5

## 2023-05-31 MED ORDER — TETANUS-DIPHTH-ACELL PERTUSSIS 5-2.5-18.5 LF-MCG/0.5 IM SUSY
0.5000 mL | PREFILLED_SYRINGE | Freq: Once | INTRAMUSCULAR | Status: AC
Start: 2023-05-31 — End: 2023-05-31
  Administered 2023-05-31: 0.5 mL via INTRAMUSCULAR
  Filled 2023-05-31: qty 0.5

## 2023-05-31 NOTE — Discharge Instructions (Addendum)
 Monitor closely for infection.  See primary care, go to urgent care, or return to the emergency department for concerns.  Apply antibiotic ointment on the open area 2 times per day.  Take Tylenol  or ibuprofen  if needed for pain.

## 2023-05-31 NOTE — ED Triage Notes (Signed)
 Pt has piece of metal under rt hand middle finger- happened yesterday.

## 2023-05-31 NOTE — ED Provider Notes (Signed)
 [facility] Provider Note    Event Date/Time   First MD Initiated Contact with Patient 05/31/23 1330     (approximate)   History   Foreign Body   HPI  Nicole Berg is a 38 y.o. female with history of preeclampsia, DVT and as listed in EMR presents to the emergency department for removal of foreign body.  She scooped up a pile of hangers and somehow scraped across something metal that embedded under her fingernail.  The right middle finger has started to become tender.  Incident occurred yesterday.  Unsure of last tetanus vaccination.      Physical Exam   Triage Vital Signs: ED Triage Vitals  Encounter Vitals Group     BP 05/31/23 1222 116/87     Systolic BP Percentile --      Diastolic BP Percentile --      Pulse Rate 05/31/23 1222 76     Resp 05/31/23 1222 17     Temp 05/31/23 1222 98.2 F (36.8 C)     Temp Source 05/31/23 1222 Oral     SpO2 05/31/23 1222 98 %     Weight 05/31/23 1225 214 lb (97.1 kg)     Height 05/31/23 1225 5\' 6"  (1.676 m)     Head Circumference --      Peak Flow --      Pain Score 05/31/23 1225 3     Pain Loc --      Pain Education --      Exclude from Growth Chart --     Most recent vital signs: Vitals:   05/31/23 1222  BP: 116/87  Pulse: 76  Resp: 17  Temp: 98.2 F (36.8 C)  SpO2: 98%    General: Awake, no distress.  CV:  Good peripheral perfusion.  Resp:  Normal effort.  Abd:  No distention.  Other:  Approximately 3 mm metal splinter under nail of the right middle finger   ED Results / Procedures / Treatments   Labs (all labs ordered are listed, but only abnormal results are displayed) Labs Reviewed - No data to display   EKG  Not indicated   RADIOLOGY  Image and radiology report reviewed and interpreted by me. Radiology report consistent with the same.  Not indicated  PROCEDURES:  Critical Care performed: No  .Foreign Body Removal  Date/Time: 05/31/2023 2:40 PM  Performed by:  Sherryle Don, FNP Authorized by: Sherryle Don, FNP  Consent: Verbal consent obtained. Risks and benefits: risks, benefits and alternatives were discussed Patient understanding: patient states understanding of the procedure being performed Imaging studies: imaging studies available Patient identity confirmed: verbally with patient Anesthesia: digital block  Anesthesia: Local Anesthetic: lidocaine  1% without epinephrine Complexity: simple 1 objects recovered. Objects recovered: metal fleck Post-procedure assessment: foreign body removed Comments: Wedge cut into nail and that section removed to expose nailbed and metal fleck.     MEDICATIONS ORDERED IN ED:  Medications  lidocaine  (PF) (XYLOCAINE ) 1 % injection 5 mL (5 mLs Intradermal Given by Other 05/31/23 1409)  Tdap (BOOSTRIX) injection 0.5 mL (0.5 mLs Intramuscular Given 05/31/23 1441)     IMPRESSION / MDM / ASSESSMENT AND PLAN / ED COURSE   I have reviewed the triage note.  Differential diagnosis includes, but is not limited to, retained foreign body  Patient's presentation is most consistent with acute, uncomplicated illness.  38 year old female presenting to the emergency department for treatment and evaluation of retained foreign body.  See HPI  for further details.  On exam, she does have a very small metal splinter under the fingernail of the right middle finger.  No obvious infection.  Finger pad is soft.  Vital signs are normal.  Plan will be to do a digital block and remove the portion of nail covering the foreign body.  She is unsure of her last tetanus vaccine and will be updated today.  Foreign body removed successfully as described above.  No active bleeding post procedure. Band Aid applied.  Home care and outpatient follow-up discussed.  ER return precautions given.      FINAL CLINICAL IMPRESSION(S) / ED DIAGNOSES   Final diagnoses:  Retained foreign body     Rx / DC Orders   ED Discharge  Orders     None        Note:  This document was prepared using Dragon voice recognition software and may include unintentional dictation errors.   Sherryle Don, FNP 05/31/23 1447    Collis Deaner, MD 06/01/23 1616
